# Patient Record
Sex: Male | Born: 1960 | Race: White | Hispanic: No | Smoking: Never smoker
Health system: Southern US, Community
[De-identification: ages and names within clinical notes are randomized; demographics above are authoritative.]

## PROBLEM LIST (undated history)

## (undated) DIAGNOSIS — E079 Disorder of thyroid, unspecified: Secondary | ICD-10-CM

## (undated) DIAGNOSIS — M47812 Spondylosis without myelopathy or radiculopathy, cervical region: Secondary | ICD-10-CM

## (undated) DIAGNOSIS — I6529 Occlusion and stenosis of unspecified carotid artery: Secondary | ICD-10-CM

## (undated) DIAGNOSIS — G8929 Other chronic pain: Secondary | ICD-10-CM

## (undated) DIAGNOSIS — E785 Hyperlipidemia, unspecified: Secondary | ICD-10-CM

## (undated) DIAGNOSIS — F329 Major depressive disorder, single episode, unspecified: Secondary | ICD-10-CM

## (undated) DIAGNOSIS — I10 Essential (primary) hypertension: Secondary | ICD-10-CM

## (undated) DIAGNOSIS — N200 Calculus of kidney: Secondary | ICD-10-CM

## (undated) DIAGNOSIS — E119 Type 2 diabetes mellitus without complications: Secondary | ICD-10-CM

## (undated) DIAGNOSIS — J45909 Unspecified asthma, uncomplicated: Secondary | ICD-10-CM

## (undated) DIAGNOSIS — I2510 Atherosclerotic heart disease of native coronary artery without angina pectoris: Secondary | ICD-10-CM

## (undated) DIAGNOSIS — F419 Anxiety disorder, unspecified: Secondary | ICD-10-CM

## (undated) DIAGNOSIS — K5090 Crohn's disease, unspecified, without complications: Secondary | ICD-10-CM

## (undated) DIAGNOSIS — F32A Depression, unspecified: Secondary | ICD-10-CM

## (undated) DIAGNOSIS — M791 Myalgia, unspecified site: Secondary | ICD-10-CM

## (undated) HISTORY — DX: Hypomagnesemia: E83.42

## (undated) HISTORY — PX: CORONARY ARTERY BYPASS GRAFT: SHX141

## (undated) HISTORY — DX: Depression, unspecified: F32.A

## (undated) HISTORY — DX: Occlusion and stenosis of unspecified carotid artery: I65.29

## (undated) HISTORY — DX: Myalgia, unspecified site: M79.10

## (undated) HISTORY — PX: KIDNEY STONE SURGERY: SHX686

## (undated) HISTORY — DX: Hyperlipidemia, unspecified: E78.5

## (undated) HISTORY — DX: Spondylosis without myelopathy or radiculopathy, cervical region: M47.812

## (undated) HISTORY — DX: Major depressive disorder, single episode, unspecified: F32.9

---

## 2008-01-05 ENCOUNTER — Emergency Department (HOSPITAL_BASED_OUTPATIENT_CLINIC_OR_DEPARTMENT_OTHER): Admission: EM | Admit: 2008-01-05 | Discharge: 2008-01-05 | Payer: Self-pay | Admitting: Emergency Medicine

## 2009-05-23 ENCOUNTER — Ambulatory Visit: Payer: Self-pay | Admitting: Diagnostic Radiology

## 2009-05-23 ENCOUNTER — Emergency Department (HOSPITAL_BASED_OUTPATIENT_CLINIC_OR_DEPARTMENT_OTHER): Admission: EM | Admit: 2009-05-23 | Discharge: 2009-05-23 | Payer: Self-pay | Admitting: Emergency Medicine

## 2010-06-10 LAB — URINALYSIS, ROUTINE W REFLEX MICROSCOPIC
Glucose, UA: >1000 mg/dL — AB
Hgb urine dipstick: NEGATIVE
Ketones, ur: 15 mg/dL — AB
Leukocytes, UA: NEGATIVE
Nitrite: NEGATIVE
Protein, ur: NEGATIVE mg/dL
Specific Gravity, Urine: 1.025 (ref 1.005–1.030)

## 2010-06-10 LAB — BASIC METABOLIC PANEL
CO2: 28 mEq/L (ref 19–32)
Calcium: 9.1 mg/dL (ref 8.4–10.5)
Creatinine, Ser: 0.8 mg/dL (ref 0.4–1.5)
GFR calc Af Amer: >60 mL/min (ref >60)
GFR calc non Af Amer: >60 mL/min (ref >60)

## 2010-06-10 LAB — DIFFERENTIAL
Basophils Absolute: 0 10*3/uL (ref 0.0–0.1)
Eosinophils Absolute: 0 10*3/uL (ref 0.0–0.7)
Eosinophils Relative: 0 % (ref 0–5)
Monocytes Absolute: 0.8 10*3/uL (ref 0.1–1.0)
Neutrophils Relative %: 75 % (ref 43–77)

## 2010-06-10 LAB — CBC
HCT: 48.4 % (ref 39.0–52.0)
Hemoglobin: 16.8 g/dL (ref 13.0–17.0)
RBC: 5.45 MIL/uL (ref 4.22–5.81)
WBC: 6 10*3/uL (ref 4.0–10.5)

## 2010-12-17 LAB — GLUCOSE, CAPILLARY: Glucose-Capillary: 236 — ABNORMAL HIGH

## 2012-06-09 ENCOUNTER — Encounter (HOSPITAL_BASED_OUTPATIENT_CLINIC_OR_DEPARTMENT_OTHER): Payer: Self-pay | Admitting: *Deleted

## 2012-06-09 ENCOUNTER — Emergency Department (HOSPITAL_BASED_OUTPATIENT_CLINIC_OR_DEPARTMENT_OTHER)
Admission: EM | Admit: 2012-06-09 | Discharge: 2012-06-09 | Disposition: A | Discharge status: Home / Self Care | Payer: Medicaid Other | Attending: Emergency Medicine | Admitting: Emergency Medicine

## 2012-06-09 ENCOUNTER — Emergency Department (HOSPITAL_BASED_OUTPATIENT_CLINIC_OR_DEPARTMENT_OTHER): Payer: Medicaid Other

## 2012-06-09 DIAGNOSIS — Z8719 Personal history of other diseases of the digestive system: Secondary | ICD-10-CM | POA: Insufficient documentation

## 2012-06-09 DIAGNOSIS — R0602 Shortness of breath: Secondary | ICD-10-CM | POA: Diagnosis present

## 2012-06-09 DIAGNOSIS — Z79899 Other long term (current) drug therapy: Secondary | ICD-10-CM | POA: Insufficient documentation

## 2012-06-09 DIAGNOSIS — E119 Type 2 diabetes mellitus without complications: Secondary | ICD-10-CM | POA: Diagnosis not present

## 2012-06-09 DIAGNOSIS — I10 Essential (primary) hypertension: Secondary | ICD-10-CM | POA: Diagnosis not present

## 2012-06-09 DIAGNOSIS — J45901 Unspecified asthma with (acute) exacerbation: Secondary | ICD-10-CM | POA: Diagnosis not present

## 2012-06-09 DIAGNOSIS — F411 Generalized anxiety disorder: Secondary | ICD-10-CM | POA: Diagnosis not present

## 2012-06-09 DIAGNOSIS — R0789 Other chest pain: Secondary | ICD-10-CM | POA: Insufficient documentation

## 2012-06-09 DIAGNOSIS — R002 Palpitations: Secondary | ICD-10-CM | POA: Diagnosis not present

## 2012-06-09 DIAGNOSIS — F419 Anxiety disorder, unspecified: Secondary | ICD-10-CM

## 2012-06-09 DIAGNOSIS — Z87442 Personal history of urinary calculi: Secondary | ICD-10-CM | POA: Insufficient documentation

## 2012-06-09 HISTORY — DX: Type 2 diabetes mellitus without complications: E11.9

## 2012-06-09 HISTORY — DX: Unspecified asthma, uncomplicated: J45.909

## 2012-06-09 HISTORY — DX: Calculus of kidney: N20.0

## 2012-06-09 HISTORY — DX: Anxiety disorder, unspecified: F41.9

## 2012-06-09 HISTORY — DX: Essential (primary) hypertension: I10

## 2012-06-09 HISTORY — DX: Crohn's disease, unspecified, without complications: K50.90

## 2012-06-09 LAB — COMPREHENSIVE METABOLIC PANEL
ALT: 31 U/L (ref 0–53)
AST: 24 U/L (ref 0–37)
CO2: 26 mEq/L (ref 19–32)
Chloride: 97 mEq/L (ref 96–112)
GFR calc Af Amer: >90 mL/min (ref >90)
Sodium: 136 mEq/L (ref 135–145)
Total Bilirubin: 0.4 mg/dL (ref 0.3–1.2)

## 2012-06-09 LAB — CBC WITH DIFFERENTIAL/PLATELET
Basophils Absolute: 0.1 10*3/uL (ref 0.0–0.1)
Basophils Relative: 1 % (ref 0–1)
Eosinophils Absolute: 0.2 10*3/uL (ref 0.0–0.7)
HCT: 48 % (ref 39.0–52.0)
Hemoglobin: 17.1 g/dL — ABNORMAL HIGH (ref 13.0–17.0)
Lymphocytes Relative: 22 % (ref 12–46)
Lymphs Abs: 2.3 10*3/uL (ref 0.7–4.0)
MCHC: 35.6 g/dL (ref 30.0–36.0)
Monocytes Absolute: 0.9 10*3/uL (ref 0.1–1.0)
Neutro Abs: 7.2 10*3/uL (ref 1.7–7.7)
Neutrophils Relative %: 68 % (ref 43–77)
Platelets: 317 10*3/uL (ref 150–400)

## 2012-06-09 LAB — TROPONIN I: Troponin I: <0.3 ng/mL (ref <0.30)

## 2012-06-09 MED ORDER — ONDANSETRON HCL 4 MG/2ML IJ SOLN: 4.0000 mg | Freq: Once | INTRAMUSCULAR | Status: DC

## 2012-06-09 MED ORDER — LORAZEPAM 2 MG/ML IJ SOLN
1.0000 mg | Freq: Once | INTRAMUSCULAR | Status: AC
  Administered 2012-06-09: 1 mg via INTRAVENOUS
  Filled 2012-06-09: qty 1

## 2012-06-09 MED ORDER — LORAZEPAM 1 MG PO TABS: 1.0000 mg | ORAL_TABLET | Freq: Three times a day (TID) | ORAL | Status: DC | PRN

## 2012-06-09 MED ORDER — ALBUTEROL SULFATE (5 MG/ML) 0.5% IN NEBU
5.0000 mg | INHALATION_SOLUTION | Freq: Once | RESPIRATORY_TRACT | Status: AC
  Administered 2012-06-09: 5 mg via RESPIRATORY_TRACT
  Filled 2012-06-09: qty 1

## 2012-06-09 MED ORDER — IPRATROPIUM BROMIDE 0.02 % IN SOLN
0.5000 mg | Freq: Once | RESPIRATORY_TRACT | Status: AC
  Administered 2012-06-09: 0.5 mg via RESPIRATORY_TRACT
  Filled 2012-06-09: qty 2.5

## 2012-06-09 NOTE — ED Provider Notes (Addendum)
History     CSN: [id number]  Arrival date & time 06/09/12  1658   First MD Initiated Contact with Patient 06/09/12 1701      Chief Complaint  Patient presents with  . Shortness of Breath    (Consider location/radiation/quality/duration/timing/severity/associated sxs/prior treatment) Patient is a 52 y.o. male presenting with chest pain. The history is provided by the patient.  Chest Pain Pain location:  Substernal area Pain quality: aching, pressure and tightness   Pain radiates to:  Does not radiate Pain radiates to the back: no   Pain severity:  Moderate Onset quality:  Sudden Duration:  1 hour Timing:  Constant Progression:  Unchanged Chronicity:  Recurrent Context: breathing, at rest and stress   Relieved by:  Nothing Worsened by:  Nothing tried Ineffective treatments:  None tried Associated symptoms: palpitations and shortness of breath   Associated symptoms: no abdominal pain, no cough, no diaphoresis, no dizziness, no fever, no nausea, no syncope, not vomiting and no weakness   Risk factors: diabetes mellitus and hypertension   Risk factors: no coronary artery disease, no high cholesterol, no immobilization, no prior DVT/PE and no smoking     Past Medical History  Diagnosis Date  . Asthma   . Hypertension   . Diabetes mellitus without complication   . Anxiety   . Kidney stones   . Crohn disease     Past Surgical History  Procedure Laterality Date  . Kidney stone surgery      No family history on file.  History  Substance Use Topics  . Smoking status: Never Smoker   . Smokeless tobacco: Never Used  . Alcohol Use: 1.2 oz/week    2 Cans of beer per week      Review of Systems  Constitutional: Negative for fever and diaphoresis.  Respiratory: Positive for shortness of breath. Negative for cough.   Cardiovascular: Positive for chest pain and palpitations. Negative for syncope.  Gastrointestinal: Negative for nausea, vomiting and abdominal pain.   Neurological: Negative for dizziness and weakness.  All other systems reviewed and are negative.    Allergies  Celebrex  Home Medications   Current Outpatient Rx  Name  Route  Sig  Dispense  Refill  . Cinnamon 500 MG capsule   Oral   Take 500 mg by mouth daily.         Marland Kitchen GLIPIZIDE PO   Oral   Take by mouth.         . hydrochlorothiazide (HYDRODIURIL) 25 MG tablet   Oral   Take 25 mg by mouth daily.         . metFORMIN (GLUCOPHAGE) 500 MG tablet   Oral   Take 500 mg by mouth 2 (two) times daily with a meal.         . Multiple Vitamin (MULTIVITAMIN) capsule   Oral   Take 1 capsule by mouth daily.           BP 201/88  Pulse 109  Temp(Src) 97.7 F (36.5 C) (Oral)  Resp 28  Ht 5\' 10"  (1.778 m)  Wt 226 lb (102.513 kg)  BMI 32.43 kg/m2  SpO2 96%  Physical Exam  Nursing note and vitals reviewed. Constitutional: He is oriented to person, place, and time. He appears well-developed and well-nourished. He appears distressed.  Anxious appearing  HENT:  Head: Normocephalic and atraumatic.  Mouth/Throat: Oropharynx is clear and moist.  Eyes: Conjunctivae and EOM are normal. Pupils are equal, round, and reactive to light.  Neck: Normal range of motion. Neck supple.  Cardiovascular: Regular rhythm and intact distal pulses.  Tachycardia present.   No murmur heard. Pulmonary/Chest: Effort normal and breath sounds normal. No respiratory distress. He has no wheezes. He has no rales. He exhibits no tenderness.  Abdominal: Soft. He exhibits no distension. There is no tenderness. There is no rebound and no guarding.  Musculoskeletal: Normal range of motion. He exhibits no edema and no tenderness.  Neurological: He is alert and oriented to person, place, and time.  Skin: Skin is warm and dry. No rash noted. No erythema.  Psychiatric: His behavior is normal. His mood appears anxious.    ED Course  Procedures (including critical care time)  Labs Reviewed  CBC WITH  DIFFERENTIAL - Abnormal; Notable for the following:    WBC 10.6 (*)    Hemoglobin 17.1 (*)    All other components within normal limits  COMPREHENSIVE METABOLIC PANEL - Abnormal; Notable for the following:    Glucose, Bld 249 (*)    All other components within normal limits  TROPONIN I  D-DIMER, QUANTITATIVE  TROPONIN I   Dg Chest 2 View  06/09/2012  *RADIOLOGY REPORT*  Clinical Data: Shortness of breath.  CHEST - 2 VIEW  Comparison: None.  Findings: Lungs are clear.  Heart size is normal.  No pneumothorax or pleural fluid.  IMPRESSION: Negative chest.   Original Report Authenticated By: Holley Dexter, M.D.      Date: 06/09/2012  Rate: 112  Rhythm: sinus tachycardia  QRS Axis: normal  Intervals: normal  ST/T Wave abnormalities: normal  Conduction Disutrbances:none  Narrative Interpretation:   Old EKG Reviewed: none available    1. Atypical chest pain   2. Anxiety       MDM   Patient presenting with abrupt onset of chest tightness, shortness of breath that started 1 hour ago. He is describing tightness in his chest, shortness of breath and impending feeling of doom. Patient appears anxious and is to, hypertensive and mildly tachycardic. He denies any prior heart history and states that 2 years ago he had an extensive workup done in [facility] including stress testing for similar events and was diagnosed with anxiety. He also complains that there's been a lot of increased stress in his life recently and he has been more anxious. He denies any upper respiratory issues and has not been wheezing.  No wrist fractures for PE except for tachycardia. EKG shows a sinus tachycardia but no other symptoms concerning for cardiac etiology. No current wheezing on exam and other than appearing anxious with mild tachycardia patient's exam is negative. Will give 1 mg of Ativan. Chest x-ray, CBC, BMP, troponin, d-dimer pending  7:13 PM All labs within normal limits. On repeat exam  patient states that Ativan improved some but he still feels tightness and shortness of breath. Will try albuterol and Atrovent and received a 3 hour troponin.    8:57 PM After second dose of ativan and albuterol/atrovent pt feels much better.  HR improved to the 90's.  Second troponin neg.  Will d/c home and have pt f/u with PCP if symptoms persist.  Gwyneth Sprout, MD 06/09/12 1610  Gwyneth Sprout, MD 06/09/12 2059

## 2012-06-09 NOTE — ED Notes (Signed)
Pt reports sob x 1 hour- hx of asthma

## 2012-06-09 NOTE — ED Notes (Signed)
Pt. states the HHN helped "some", but still feels tight in his chest.

## 2012-06-09 NOTE — ED Notes (Signed)
Chest tightness with SHOB, no radiation x 1 1/2 hours.  No n/v.  Reports diaphoresis earlier.  Describes pain as "something sitting on my chest".   Hasn't taken any meds for pain.

## 2013-06-29 ENCOUNTER — Encounter (HOSPITAL_BASED_OUTPATIENT_CLINIC_OR_DEPARTMENT_OTHER): Payer: Self-pay | Admitting: Emergency Medicine

## 2013-06-29 ENCOUNTER — Emergency Department (HOSPITAL_BASED_OUTPATIENT_CLINIC_OR_DEPARTMENT_OTHER)
Admission: EM | Admit: 2013-06-29 | Discharge: 2013-06-29 | Disposition: A | Discharge status: Home / Self Care | Payer: Medicaid Other | Attending: Emergency Medicine | Admitting: Emergency Medicine

## 2013-06-29 DIAGNOSIS — Z87442 Personal history of urinary calculi: Secondary | ICD-10-CM | POA: Diagnosis not present

## 2013-06-29 DIAGNOSIS — F411 Generalized anxiety disorder: Secondary | ICD-10-CM | POA: Diagnosis not present

## 2013-06-29 DIAGNOSIS — Z8719 Personal history of other diseases of the digestive system: Secondary | ICD-10-CM | POA: Diagnosis not present

## 2013-06-29 DIAGNOSIS — J45909 Unspecified asthma, uncomplicated: Secondary | ICD-10-CM | POA: Insufficient documentation

## 2013-06-29 DIAGNOSIS — IMO0001 Reserved for inherently not codable concepts without codable children: Secondary | ICD-10-CM | POA: Insufficient documentation

## 2013-06-29 DIAGNOSIS — Z79899 Other long term (current) drug therapy: Secondary | ICD-10-CM | POA: Diagnosis not present

## 2013-06-29 DIAGNOSIS — I10 Essential (primary) hypertension: Secondary | ICD-10-CM | POA: Insufficient documentation

## 2013-06-29 DIAGNOSIS — R52 Pain, unspecified: Secondary | ICD-10-CM | POA: Diagnosis present

## 2013-06-29 DIAGNOSIS — M791 Myalgia, unspecified site: Secondary | ICD-10-CM

## 2013-06-29 DIAGNOSIS — E119 Type 2 diabetes mellitus without complications: Secondary | ICD-10-CM | POA: Insufficient documentation

## 2013-06-29 HISTORY — DX: Disorder of thyroid, unspecified: E07.9

## 2013-06-29 LAB — CBC WITH DIFFERENTIAL/PLATELET
BASOS ABS: 0.1 10*3/uL (ref 0.0–0.1)
Basophils Relative: 1 % (ref 0–1)
EOS ABS: 0.1 10*3/uL (ref 0.0–0.7)
EOS PCT: 1 % (ref 0–5)
HCT: 47.7 % (ref 39.0–52.0)
Hemoglobin: 17 g/dL (ref 13.0–17.0)
Lymphocytes Relative: 19 % (ref 12–46)
Lymphs Abs: 1.8 10*3/uL (ref 0.7–4.0)
MCH: 30.8 pg (ref 26.0–34.0)
MCHC: 35.6 g/dL (ref 30.0–36.0)
MCV: 86.4 fL (ref 78.0–100.0)
Monocytes Absolute: 0.8 10*3/uL (ref 0.1–1.0)
Monocytes Relative: 8 % (ref 3–12)
NEUTROS PCT: 71 % (ref 43–77)
Neutro Abs: 6.8 10*3/uL (ref 1.7–7.7)
Platelets: 279 10*3/uL (ref 150–400)
RBC: 5.52 MIL/uL (ref 4.22–5.81)
RDW: 12.7 % (ref 11.5–15.5)
WBC: 9.5 10*3/uL (ref 4.0–10.5)

## 2013-06-29 LAB — COMPREHENSIVE METABOLIC PANEL
ALK PHOS: 57 U/L (ref 39–117)
ALT: 23 U/L (ref 0–53)
AST: 19 U/L (ref 0–37)
Albumin: 4 g/dL (ref 3.5–5.2)
BUN: 13 mg/dL (ref 6–23)
CALCIUM: 10.6 mg/dL — AB (ref 8.4–10.5)
CO2: 28 mEq/L (ref 19–32)
Chloride: 96 mEq/L (ref 96–112)
Creatinine, Ser: 0.8 mg/dL (ref 0.50–1.35)
GFR calc Af Amer: >90 mL/min (ref >90)
GFR calc non Af Amer: >90 mL/min (ref >90)
Glucose, Bld: 205 mg/dL — ABNORMAL HIGH (ref 70–99)
POTASSIUM: 4.3 meq/L (ref 3.7–5.3)
Sodium: 138 mEq/L (ref 137–147)
TOTAL PROTEIN: 7.4 g/dL (ref 6.0–8.3)
Total Bilirubin: 0.4 mg/dL (ref 0.3–1.2)

## 2013-06-29 LAB — SEDIMENTATION RATE: Sed Rate: 2 mm/hr (ref 0–16)

## 2013-06-29 LAB — URINALYSIS, ROUTINE W REFLEX MICROSCOPIC
Bilirubin Urine: NEGATIVE
GLUCOSE, UA: NEGATIVE mg/dL
HGB URINE DIPSTICK: NEGATIVE
Ketones, ur: NEGATIVE mg/dL
Leukocytes, UA: NEGATIVE
NITRITE: NEGATIVE
PH: 5 (ref 5.0–8.0)
PROTEIN: NEGATIVE mg/dL
Specific Gravity, Urine: 1.007 (ref 1.005–1.030)
Urobilinogen, UA: 0.2 mg/dL (ref 0.0–1.0)

## 2013-06-29 MED ORDER — HYDROCODONE-ACETAMINOPHEN 5-325 MG PO TABS: 2.0000 | ORAL_TABLET | ORAL | Status: DC | PRN

## 2013-06-29 MED ORDER — IBUPROFEN 800 MG PO TABS: 800.0000 mg | ORAL_TABLET | Freq: Three times a day (TID) | ORAL | Status: DC

## 2013-06-29 NOTE — ED Provider Notes (Signed)
Medical screening examination/treatment/procedure(s) were performed by non-physician practitioner and as supervising physician I was immediately available for consultation/collaboration.   EKG Interpretation None        Rolan BuccoMelanie Verlena Marlette, MD 06/29/13 1947

## 2013-06-29 NOTE — Discharge Instructions (Signed)
Myalgia, Adult °Myalgia is the medical term for muscle pain. It is a symptom of many things. Nearly everyone at some time in their life has this. The most common cause for muscle pain is overuse or straining and more so when you are not in shape. Injuries and muscle bruises cause myalgias. Muscle pain without a history of injury can also be caused by a virus. It frequently comes along with the flu. Myalgia not caused by muscle strain can be present in a large number of infectious diseases. Some autoimmune diseases like lupus and fibromyalgia can cause muscle pain. Myalgia may be mild, or severe. °SYMPTOMS  °The symptoms of myalgia are simply muscle pain. Most of the time this is short lived and the pain goes away without treatment. °DIAGNOSIS  °Myalgia is diagnosed by your caregiver by taking your history. This means you tell him when the problems began, what they are, and what has been happening. If this has not been a long term problem, your caregiver may want to watch for a while to see what will happen. If it has been long term, they may want to do additional testing. °TREATMENT  °The treatment depends on what the underlying cause of the muscle pain is. Often anti-inflammatory medications will help. °HOME CARE INSTRUCTIONS °· If the pain in your muscles came from overuse, slow down your activities until the problems go away. °· Myalgia from overuse of a muscle can be treated with alternating hot and cold packs on the muscle affected or with cold for the first couple days. If either heat or cold seems to make things worse, stop their use. °· Apply ice to the sore area for 15-20 minutes, 03-04 times per day, while awake for the first 2 days of muscle soreness, or as directed. Put the ice in a plastic bag and place a towel between the bag of ice and your skin. °· Only take over-the-counter or prescription medicines for pain, discomfort, or fever as directed by your caregiver. °· Regular gentle exercise may help if  you are not active. °· Stretching before strenuous exercise can help lower the risk of myalgia. It is normal when beginning an exercise regimen to feel some muscle pain after exercising. Muscles that have not been used frequently will be sore at first. If the pain is extreme, this may mean injury to a muscle. °SEEK MEDICAL CARE IF: °· You have an increase in muscle pain that is not relieved with medication. °· You begin to run a temperature. °· You develop nausea and vomiting. °· You develop a stiff and painful neck. °· You develop a rash. °· You develop muscle pain after a tick bite. °· You have continued muscle pain while working out even after you are in good condition. °SEEK IMMEDIATE MEDICAL CARE IF: °Any of your problems are getting worse and medications are not helping. °MAKE SURE YOU:  °· Understand these instructions. °· Will watch your condition. °· Will get help right away if you are not doing well or get worse. °Document Released: 01/23/2006 Document Revised: 05/26/2011 Document Reviewed: 04/14/2006 °[employer]® Patient Information ©2014 [employer]. ° °

## 2013-06-29 NOTE — ED Provider Notes (Signed)
CSN: [id number]     Arrival date & time 06/29/13  1626 History   First MD Initiated Contact with Patient 06/29/13 1652     Chief Complaint  Patient presents with  . Generalized Body Aches     (Consider location/radiation/quality/duration/timing/severity/associated sxs/prior Treatment) Patient is a 53 y.o. male presenting with musculoskeletal pain. The history is provided by the patient. No language interpreter was used.  Muscle Pain This is a new problem. The current episode started 1 to 4 weeks ago. The problem occurs constantly. The problem has been gradually worsening. Pertinent negatives include no fever, headaches or rash. Nothing aggravates the symptoms. He has tried nothing for the symptoms. The treatment provided no relief.  Pt complains of a tick bite a week ago.  Pt saw MD and had a negative rmsf and lyme.  Pt reports continued bodt aces  Past Medical History  Diagnosis Date  . Asthma   . Hypertension   . Diabetes mellitus without complication   . Anxiety   . Kidney stones   . Crohn disease   . Thyroid disease    Past Surgical History  Procedure Laterality Date  . Kidney stone surgery     No family history on file. History  Substance Use Topics  . Smoking status: Never Smoker   . Smokeless tobacco: Never Used  . Alcohol Use: Yes    Review of Systems  Constitutional: Negative for fever.  Skin: Negative for rash.  Neurological: Negative for headaches.  All other systems reviewed and are negative.     Allergies  Celebrex  Home Medications   Prior to Admission medications   Medication Sig Start Date End Date Taking? Authorizing Provider  Cinnamon 500 MG capsule Take 500 mg by mouth daily.    Historical Provider, MD  GLIPIZIDE PO Take by mouth.    Historical Provider, MD  hydrochlorothiazide (HYDRODIURIL) 25 MG tablet Take 25 mg by mouth daily.    Historical Provider, MD  LORazepam (ATIVAN) 1 MG tablet Take 1 tablet (1 mg total) by mouth 3 (three) times  daily as needed for anxiety. 06/09/12   Gwyneth SproutWhitney Plunkett, MD  metFORMIN (GLUCOPHAGE) 500 MG tablet Take 500 mg by mouth 2 (two) times daily with a meal.    Historical Provider, MD  Multiple Vitamin (MULTIVITAMIN) capsule Take 1 capsule by mouth daily.    Historical Provider, MD   BP 176/90  Pulse 72  Temp(Src) 98.2 F (36.8 C) (Oral)  Resp 20  Ht 5\' 10"  (1.778 m)  Wt 220 lb (99.791 kg)  BMI 31.57 kg/m2  SpO2 100% Physical Exam  Nursing note and vitals reviewed. Constitutional: He is oriented to person, place, and time. He appears well-developed and well-nourished.  HENT:  Head: Normocephalic and atraumatic.  Right Ear: External ear normal.  Left Ear: External ear normal.  Nose: Nose normal.  Mouth/Throat: Oropharynx is clear and moist.  Eyes: Conjunctivae and EOM are normal. Pupils are equal, round, and reactive to light.  Neck: Normal range of motion.  Cardiovascular: Normal rate and normal heart sounds.   Pulmonary/Chest: Effort normal.  Abdominal: Soft. He exhibits no distension.  Musculoskeletal: Normal range of motion.  Neurological: He is alert and oriented to person, place, and time.  Skin: Skin is warm.  Psychiatric: He has a normal mood and affect.    ED Course  Procedures (including critical care time) Labs Review Labs Reviewed - No data to display  Imaging Review No results found.   EKG Interpretation None  MDM  Labs normal, sed rate normal.   Pt advised to follow up with his MD for recheck.   Final diagnoses:  None   Results for orders placed during the hospital encounter of 06/29/13  URINALYSIS, ROUTINE W REFLEX MICROSCOPIC      Result Value Ref Range   Color, Urine YELLOW  YELLOW   APPearance CLEAR  CLEAR   Specific Gravity, Urine 1.007  1.005 - 1.030   pH 5.0  5.0 - 8.0   Glucose, UA NEGATIVE  NEGATIVE mg/dL   Hgb urine dipstick NEGATIVE  NEGATIVE   Bilirubin Urine NEGATIVE  NEGATIVE   Ketones, ur NEGATIVE  NEGATIVE mg/dL   Protein, ur  NEGATIVE  NEGATIVE mg/dL   Urobilinogen, UA 0.2  0.0 - 1.0 mg/dL   Nitrite NEGATIVE  NEGATIVE   Leukocytes, UA NEGATIVE  NEGATIVE  CBC WITH DIFFERENTIAL      Result Value Ref Range   WBC 9.5  4.0 - 10.5 K/uL   RBC 5.52  4.22 - 5.81 MIL/uL   Hemoglobin 17.0  13.0 - 17.0 g/dL   HCT 29.547.7  62.139.0 - 30.852.0 %   MCV 86.4  78.0 - 100.0 fL   MCH 30.8  26.0 - 34.0 pg   MCHC 35.6  30.0 - 36.0 g/dL   RDW 65.712.7  84.611.5 - 96.215.5 %   Platelets 279  150 - 400 K/uL   Neutrophils Relative % 71  43 - 77 %   Neutro Abs 6.8  1.7 - 7.7 K/uL   Lymphocytes Relative 19  12 - 46 %   Lymphs Abs 1.8  0.7 - 4.0 K/uL   Monocytes Relative 8  3 - 12 %   Monocytes Absolute 0.8  0.1 - 1.0 K/uL   Eosinophils Relative 1  0 - 5 %   Eosinophils Absolute 0.1  0.0 - 0.7 K/uL   Basophils Relative 1  0 - 1 %   Basophils Absolute 0.1  0.0 - 0.1 K/uL  COMPREHENSIVE METABOLIC PANEL      Result Value Ref Range   Sodium 138  137 - 147 mEq/L   Potassium 4.3  3.7 - 5.3 mEq/L   Chloride 96  96 - 112 mEq/L   CO2 28  19 - 32 mEq/L   Glucose, Bld 205 (*) 70 - 99 mg/dL   BUN 13  6 - 23 mg/dL   Creatinine, Ser 9.520.80  0.50 - 1.35 mg/dL   Calcium 84.110.6 (*) 8.4 - 10.5 mg/dL   Total Protein 7.4  6.0 - 8.3 g/dL   Albumin 4.0  3.5 - 5.2 g/dL   AST 19  0 - 37 U/L   ALT 23  0 - 53 U/L   Alkaline Phosphatase 57  39 - 117 U/L   Total Bilirubin 0.4  0.3 - 1.2 mg/dL   GFR calc non Af Amer >90  >90 mL/min   GFR calc Af Amer >90  >90 mL/min  SEDIMENTATION RATE      Result Value Ref Range   Sed Rate 2  0 - 16 mm/hr   No results found.     Lonia SkinnerLeslie K AngolaSofia, PA-C 06/29/13 1924

## 2013-06-29 NOTE — ED Notes (Signed)
Tick bite approx 1 weeks ago-was seen by PCP-neg tests for RMSF and Lymes-pt c/o cont'd body aches

## 2013-06-29 NOTE — ED Notes (Signed)
PA at bedside.

## 2013-07-12 ENCOUNTER — Emergency Department (HOSPITAL_BASED_OUTPATIENT_CLINIC_OR_DEPARTMENT_OTHER)
Admission: EM | Admit: 2013-07-12 | Discharge: 2013-07-12 | Disposition: A | Discharge status: Home / Self Care | Payer: Medicaid Other | Attending: Emergency Medicine | Admitting: Emergency Medicine

## 2013-07-12 ENCOUNTER — Encounter (HOSPITAL_BASED_OUTPATIENT_CLINIC_OR_DEPARTMENT_OTHER): Payer: Self-pay | Admitting: Emergency Medicine

## 2013-07-12 DIAGNOSIS — IMO0001 Reserved for inherently not codable concepts without codable children: Secondary | ICD-10-CM | POA: Diagnosis present

## 2013-07-12 DIAGNOSIS — R110 Nausea: Secondary | ICD-10-CM | POA: Diagnosis not present

## 2013-07-12 DIAGNOSIS — Z8719 Personal history of other diseases of the digestive system: Secondary | ICD-10-CM | POA: Insufficient documentation

## 2013-07-12 DIAGNOSIS — Z791 Long term (current) use of non-steroidal anti-inflammatories (NSAID): Secondary | ICD-10-CM | POA: Diagnosis not present

## 2013-07-12 DIAGNOSIS — Z87442 Personal history of urinary calculi: Secondary | ICD-10-CM | POA: Insufficient documentation

## 2013-07-12 DIAGNOSIS — M791 Myalgia, unspecified site: Secondary | ICD-10-CM

## 2013-07-12 DIAGNOSIS — E119 Type 2 diabetes mellitus without complications: Secondary | ICD-10-CM | POA: Diagnosis not present

## 2013-07-12 DIAGNOSIS — M549 Dorsalgia, unspecified: Secondary | ICD-10-CM | POA: Insufficient documentation

## 2013-07-12 DIAGNOSIS — M79609 Pain in unspecified limb: Secondary | ICD-10-CM | POA: Diagnosis not present

## 2013-07-12 DIAGNOSIS — Z79899 Other long term (current) drug therapy: Secondary | ICD-10-CM | POA: Diagnosis not present

## 2013-07-12 DIAGNOSIS — I10 Essential (primary) hypertension: Secondary | ICD-10-CM | POA: Insufficient documentation

## 2013-07-12 DIAGNOSIS — F411 Generalized anxiety disorder: Secondary | ICD-10-CM | POA: Insufficient documentation

## 2013-07-12 DIAGNOSIS — J45909 Unspecified asthma, uncomplicated: Secondary | ICD-10-CM | POA: Insufficient documentation

## 2013-07-12 LAB — URINALYSIS, ROUTINE W REFLEX MICROSCOPIC
Glucose, UA: NEGATIVE mg/dL
HGB URINE DIPSTICK: NEGATIVE
Ketones, ur: NEGATIVE mg/dL
LEUKOCYTES UA: NEGATIVE
Nitrite: NEGATIVE
Protein, ur: NEGATIVE mg/dL
Specific Gravity, Urine: 1.025 (ref 1.005–1.030)
UROBILINOGEN UA: 1 mg/dL (ref 0.0–1.0)
pH: 6 (ref 5.0–8.0)

## 2013-07-12 LAB — CBC
HCT: 44.8 % (ref 39.0–52.0)
HEMOGLOBIN: 15.9 g/dL (ref 13.0–17.0)
MCH: 30.5 pg (ref 26.0–34.0)
MCHC: 35.5 g/dL (ref 30.0–36.0)
MCV: 86 fL (ref 78.0–100.0)
Platelets: 250 10*3/uL (ref 150–400)
RBC: 5.21 MIL/uL (ref 4.22–5.81)
RDW: 12.8 % (ref 11.5–15.5)
WBC: 9.5 10*3/uL (ref 4.0–10.5)

## 2013-07-12 LAB — COMPREHENSIVE METABOLIC PANEL
ALT: 24 U/L (ref 0–53)
AST: 21 U/L (ref 0–37)
Albumin: 4 g/dL (ref 3.5–5.2)
Alkaline Phosphatase: 52 U/L (ref 39–117)
BUN: 21 mg/dL (ref 6–23)
CALCIUM: 10.1 mg/dL (ref 8.4–10.5)
CO2: 26 meq/L (ref 19–32)
CREATININE: 0.9 mg/dL (ref 0.50–1.35)
Chloride: 99 mEq/L (ref 96–112)
GFR calc Af Amer: >90 mL/min (ref >90)
GLUCOSE: 87 mg/dL (ref 70–99)
Potassium: 3.5 mEq/L — ABNORMAL LOW (ref 3.7–5.3)
Sodium: 143 mEq/L (ref 137–147)
Total Bilirubin: 0.4 mg/dL (ref 0.3–1.2)
Total Protein: 7.1 g/dL (ref 6.0–8.3)

## 2013-07-12 LAB — CK: CK TOTAL: 99 U/L (ref 7–232)

## 2013-07-12 MED ORDER — OXYCODONE-ACETAMINOPHEN 5-325 MG PO TABS
2.0000 | ORAL_TABLET | Freq: Once | ORAL | Status: AC
  Administered 2013-07-12: 2 via ORAL
  Filled 2013-07-12: qty 2

## 2013-07-12 MED ORDER — OXYCODONE-ACETAMINOPHEN 5-325 MG PO TABS: 1.0000 | ORAL_TABLET | ORAL | Status: DC | PRN

## 2013-07-12 NOTE — ED Provider Notes (Signed)
TIME SEEN: 9:42 PM  CHIEF COMPLAINT: myalgia   HPI: HPI Comments: Nathan Durham is a 53 y.o. male with history of hypertension, diabetes, hypothyroidism who presents to the Emergency Department complaining of recurrent, worsening myalgia including leg, arm and back pain, ongoing for greater than two weeks. Patient has been treating his pain with ibuprofen without relief. Patient reports some nausea with his pain but no vomiting, diarrhea, fever. He was seen in the emergency department 2 weeks ago and had negative workup including normal CBC, BMP, ESR. Patient had a recent tick bite but was evaluated by his PCP and had Lyme and RMSF titers drawn which were negative. He denies taking any new medications. He is not on a statin. He states he is concerned because his aunt was diagnosed with what he believes was polymyalgia rheumatica. He states he is back to the emergency department tonight because he does not have insurance to followup with a primary care physician.   ROS: See HPI Constitutional: no fever  Eyes: no drainage  ENT: no runny nose   Cardiovascular:  no chest pain  Resp: no SOB  GI: no vomiting GU: no dysuria Integumentary: rash  Allergy: no hives  Musculoskeletal: no leg swelling  Neurological: no slurred speech ROS otherwise negative  PAST MEDICAL HISTORY/PAST SURGICAL HISTORY:  Past Medical History  Diagnosis Date  . Asthma   . Hypertension   . Diabetes mellitus without complication   . Anxiety   . Kidney stones   . Crohn disease   . Thyroid disease     MEDICATIONS:  Prior to Admission medications   Medication Sig Start Date End Date Taking? Authorizing Provider  Cinnamon 500 MG capsule Take 500 mg by mouth daily.    Historical Provider, MD  GLIPIZIDE PO Take by mouth.    Historical Provider, MD  hydrochlorothiazide (HYDRODIURIL) 25 MG tablet Take 25 mg by mouth daily.    Historical Provider, MD  HYDROcodone-acetaminophen (NORCO/VICODIN) 5-325 MG per tablet  Take 2 tablets by mouth every 4 (four) hours as needed. 06/29/13   Fransico Meadow, PA-C  ibuprofen (ADVIL,MOTRIN) 800 MG tablet Take 1 tablet (800 mg total) by mouth 3 (three) times daily. 06/29/13   Fransico Meadow, PA-C  LORazepam (ATIVAN) 1 MG tablet Take 1 tablet (1 mg total) by mouth 3 (three) times daily as needed for anxiety. 06/09/12   Blanchie Dessert, MD  metFORMIN (GLUCOPHAGE) 500 MG tablet Take 500 mg by mouth 2 (two) times daily with a meal.    Historical Provider, MD  Multiple Vitamin (MULTIVITAMIN) capsule Take 1 capsule by mouth daily.    Historical Provider, MD    ALLERGIES:  Allergies  Allergen Reactions  . Celebrex [Celecoxib]     Chest tight-     SOCIAL HISTORY:  History  Substance Use Topics  . Smoking status: Never Smoker   . Smokeless tobacco: Never Used  . Alcohol Use: Yes    FAMILY HISTORY: No family history on file.  EXAM: Triage Vitals: BP 167/89  Pulse 90  Temp(Src) 97.7 F (36.5 C) (Oral)  Resp 20  Ht _0  (1.778 m)  Wt 220 lb (99.791 kg)  BMI 31.57 kg/m2  SpO2 100% CONSTITUTIONAL: Alert and oriented and responds appropriately to questions. Well-appearing; well-nourished HEAD: Normocephalic EYES: Conjunctivae clear, PERRL ENT: normal nose; no rhinorrhea; moist mucous membranes; pharynx without lesions noted NECK: Supple, no meningismus, no LAD  CARD: RRR; S1 and S2 appreciated; no murmurs, no clicks, no rubs, no gallops RESP:  Normal chest excursion without splinting or tachypnea; breath sounds clear and equal bilaterally; no wheezes, no rhonchi, no rales,  ABD/GI: Normal bowel sounds; non-distended; soft, non-tender, no rebound, no guarding BACK:  The back appears normal and is non-tender to palpation, there is no CVA tenderness EXT: Normal ROM in all joints; non-tender to palpation; no edema; normal capillary refill; no cyanosis, no tenderness to palpation of his muscles, compartments are soft, 2+ DP and radial pulses bilaterally, no joint  effusion, no induration or warmth or erythema or fluctuance    SKIN: Normal color for age and race; warm, no rash, no petechiae or purpura NEURO: Moves all extremities equally, sensation to light touch intact diffusely PSYCH: The patient's mood and manner are appropriate. Grooming and personal hygiene are appropriate.  MEDICAL DECISION MAKING: Patient here with 2 weeks of myalgias. Have discussed with patient that he may have an autoimmune disorder and needs to followup with her PCP and possible rheumatologist. We'll repeat basic labs, urine, CT to evaluate for possible acute muscle damage. We'll give Percocet for pain.  ED PROGRESS: Patient's repeat labs are unremarkable. There is no sign of myoglobin in history. His CT is also normal. He is feeling much better after Percocet. Have discussed with patient that I feel there is no life-threatening illness at this time and I recommend he followup as an outpatient for further testing including possible muscle biopsy. We'll discharge with small prescription for pain medication as he reports anti-inflammatories are not helping. Patient wife at bedside verbalize understanding and are comfortable with this plan.     [place], DO 07/13/13 0001

## 2013-07-12 NOTE — ED Notes (Signed)
2 weeks ago pt states he awoke with generalized body aches, thought he had the flu, pain in arms legs and back has progressed. At times one is worse than the other, pt [facility]. Seen previously here in er given, ibuprofen, but did not continue to take medication at home.

## 2013-07-12 NOTE — Discharge Instructions (Signed)
Myalgia, Adult °Myalgia is the medical term for muscle pain. It is a symptom of many things. Nearly everyone at some time in their life has this. The most common cause for muscle pain is overuse or straining and more so when you are not in shape. Injuries and muscle bruises cause myalgias. Muscle pain without a history of injury can also be caused by a virus. It frequently comes along with the flu. Myalgia not caused by muscle strain can be present in a large number of infectious diseases. Some autoimmune diseases like lupus and fibromyalgia can cause muscle pain. Myalgia may be mild, or severe. °SYMPTOMS  °The symptoms of myalgia are simply muscle pain. Most of the time this is short lived and the pain goes away without treatment. °DIAGNOSIS  °Myalgia is diagnosed by your caregiver by taking your history. This means you tell him when the problems began, what they are, and what has been happening. If this has not been a long term problem, your caregiver may want to watch for a while to see what will happen. If it has been long term, they may want to do additional testing. °TREATMENT  °The treatment depends on what the underlying cause of the muscle pain is. Often anti-inflammatory medications will help. °HOME CARE INSTRUCTIONS °· If the pain in your muscles came from overuse, slow down your activities until the problems go away. °· Myalgia from overuse of a muscle can be treated with alternating hot and cold packs on the muscle affected or with cold for the first couple days. If either heat or cold seems to make things worse, stop their use. °· Apply ice to the sore area for 15-20 minutes, 03-04 times per day, while awake for the first 2 days of muscle soreness, or as directed. Put the ice in a plastic bag and place a towel between the bag of ice and your skin. °· Only take over-the-counter or prescription medicines for pain, discomfort, or fever as directed by your caregiver. °· Regular gentle exercise may help if  you are not active. °· Stretching before strenuous exercise can help lower the risk of myalgia. It is normal when beginning an exercise regimen to feel some muscle pain after exercising. Muscles that have not been used frequently will be sore at first. If the pain is extreme, this may mean injury to a muscle. °SEEK MEDICAL CARE IF: °· You have an increase in muscle pain that is not relieved with medication. °· You begin to run a temperature. °· You develop nausea and vomiting. °· You develop a stiff and painful neck. °· You develop a rash. °· You develop muscle pain after a tick bite. °· You have continued muscle pain while working out even after you are in good condition. °SEEK IMMEDIATE MEDICAL CARE IF: °Any of your problems are getting worse and medications are not helping. °MAKE SURE YOU:  °· Understand these instructions. °· Will watch your condition. °· Will get help right away if you are not doing well or get worse. °Document Released: 01/23/2006 Document Revised: 05/26/2011 Document Reviewed: 04/14/2006 °[employer]® Patient Information ©2014 [employer]. ° °

## 2013-07-12 NOTE — ED Notes (Signed)
Muscle pain for 2 weeks.

## 2014-01-17 ENCOUNTER — Encounter (HOSPITAL_BASED_OUTPATIENT_CLINIC_OR_DEPARTMENT_OTHER): Payer: Self-pay | Admitting: *Deleted

## 2014-01-17 ENCOUNTER — Emergency Department (HOSPITAL_BASED_OUTPATIENT_CLINIC_OR_DEPARTMENT_OTHER)
Admission: EM | Admit: 2014-01-17 | Discharge: 2014-01-18 | Disposition: A | Discharge status: Home / Self Care | Payer: Medicaid Other | Attending: Emergency Medicine | Admitting: Emergency Medicine

## 2014-01-17 DIAGNOSIS — J45909 Unspecified asthma, uncomplicated: Secondary | ICD-10-CM | POA: Insufficient documentation

## 2014-01-17 DIAGNOSIS — I10 Essential (primary) hypertension: Secondary | ICD-10-CM | POA: Insufficient documentation

## 2014-01-17 DIAGNOSIS — Z79899 Other long term (current) drug therapy: Secondary | ICD-10-CM | POA: Diagnosis not present

## 2014-01-17 DIAGNOSIS — F419 Anxiety disorder, unspecified: Secondary | ICD-10-CM | POA: Insufficient documentation

## 2014-01-17 DIAGNOSIS — M791 Myalgia, unspecified site: Secondary | ICD-10-CM

## 2014-01-17 DIAGNOSIS — Z87442 Personal history of urinary calculi: Secondary | ICD-10-CM | POA: Diagnosis not present

## 2014-01-17 DIAGNOSIS — E119 Type 2 diabetes mellitus without complications: Secondary | ICD-10-CM | POA: Diagnosis not present

## 2014-01-17 DIAGNOSIS — R630 Anorexia: Secondary | ICD-10-CM | POA: Diagnosis not present

## 2014-01-17 DIAGNOSIS — R112 Nausea with vomiting, unspecified: Secondary | ICD-10-CM | POA: Diagnosis not present

## 2014-01-17 DIAGNOSIS — R52 Pain, unspecified: Secondary | ICD-10-CM | POA: Insufficient documentation

## 2014-01-17 DIAGNOSIS — Z8719 Personal history of other diseases of the digestive system: Secondary | ICD-10-CM | POA: Insufficient documentation

## 2014-01-17 DIAGNOSIS — E079 Disorder of thyroid, unspecified: Secondary | ICD-10-CM | POA: Insufficient documentation

## 2014-01-17 MED ORDER — ONDANSETRON HCL 4 MG/2ML IJ SOLN
4.0000 mg | Freq: Once | INTRAMUSCULAR | Status: AC
  Administered 2014-01-18: 4 mg via INTRAVENOUS
  Filled 2014-01-17: qty 2

## 2014-01-17 MED ORDER — MORPHINE SULFATE 4 MG/ML IJ SOLN
4.0000 mg | Freq: Once | INTRAMUSCULAR | Status: AC
  Administered 2014-01-18: 4 mg via INTRAVENOUS
  Filled 2014-01-17: qty 1

## 2014-01-17 MED ORDER — SODIUM CHLORIDE 0.9 % IV BOLUS (SEPSIS)
1000.0000 mL | Freq: Once | INTRAVENOUS | Status: AC
  Administered 2014-01-18: 1000 mL via INTRAVENOUS

## 2014-01-17 NOTE — ED Provider Notes (Addendum)
CSN: [id number]     Arrival date & time 01/17/14  2316 History  This chart was scribed for Gwyneth SproutWhitney Erendira Crabtree, MD by Evon Slackerrance Branch, ED Scribe. This patient was seen in room MH09/MH09 and the patient's care was started at 11:40 PM.     Chief Complaint  Patient presents with  . Muscle Pain    Patient is a 53 y.o. male presenting with musculoskeletal pain. The history is provided by the patient. No language interpreter was used.  Muscle Pain   HPI Comments: Nathan Durham is a 53 y.o. male who presents to the Emergency Department complaining of generalized myalgias onset 6 months prior he had seen a rheumatologist and was initially diagnosed with polymyalgias and started on prednisone which help with his pain. He was later told he did not have polymyalgias and started tapering off of the prednisone about 2-3 weeks ago which is making his pain worse. He states that the pain recently worsened 1 day ago.  He states that the pain is worse in his legs, shoulders and back. He states he has associated nausea and vomiting x2. He states that prednisone usually help controls his pain but is not providing relief now. He states that he has tapered down to 5 mg per day now. He states that he 15 mg a day of prednisone was helping with the pain previously. He states that he has tried Zofran with no relief tonight. He states that he has a constant low grade headache since being diagnosed with the pain. He states that he has being controlling his blood sugars since being on the prednisone by eating less than normal. He states he recently ran out his muscle relaxer this morning. He states he has been complaint with all other medications. He denies diarrhea or other related symptoms.   No fever, diarrhea or rash   Past Medical History  Diagnosis Date  . Asthma   . Hypertension   . Diabetes mellitus without complication   . Anxiety   . Kidney stones   . Crohn disease   . Thyroid disease    Past Surgical  History  Procedure Laterality Date  . Kidney stone surgery     History reviewed. No pertinent family history. History  Substance Use Topics  . Smoking status: Never Smoker   . Smokeless tobacco: Never Used  . Alcohol Use: Yes    Review of Systems  Gastrointestinal: Positive for nausea and vomiting. Negative for diarrhea.  Musculoskeletal: Positive for myalgias.   A complete 10 system review of systems was obtained and all systems are negative except as noted in the HPI and PMH.    Allergies  Celebrex  Home Medications   Prior to Admission medications   Medication Sig Start Date End Date Taking? Authorizing Provider  allopurinol (ZYLOPRIM) 100 MG tablet Take 100 mg by mouth daily.   Yes Historical Provider, MD  amLODipine (NORVASC) 5 MG tablet Take 5 mg by mouth daily.   Yes Historical Provider, MD  levothyroxine (SYNTHROID, LEVOTHROID) 75 MCG tablet Take 75 mcg by mouth daily before breakfast.   Yes Historical Provider, MD  orphenadrine (NORFLEX) 100 MG tablet Take 100 mg by mouth 2 (two) times daily.   Yes Historical Provider, MD  predniSONE (STERAPRED UNI-PAK) 5 MG TABS tablet Take by mouth.   Yes Historical Provider, MD  sitaGLIPtin-metformin (JANUMET) 50-1000 MG per tablet Take 1 tablet by mouth 2 (two) times daily with a meal.   Yes Historical Provider, MD  Cinnamon 500  MG capsule Take 500 mg by mouth daily.    Historical Provider, MD  GLIPIZIDE PO Take by mouth.    Historical Provider, MD  hydrochlorothiazide (HYDRODIURIL) 25 MG tablet Take 25 mg by mouth daily.    Historical Provider, MD  HYDROcodone-acetaminophen (NORCO/VICODIN) 5-325 MG per tablet Take 2 tablets by mouth every 4 (four) hours as needed. 06/29/13   Elson AreasLeslie K Sofia, PA-C  ibuprofen (ADVIL,MOTRIN) 800 MG tablet Take 1 tablet (800 mg total) by mouth 3 (three) times daily. 06/29/13   Elson AreasLeslie K Sofia, PA-C  LORazepam (ATIVAN) 1 MG tablet Take 1 tablet (1 mg total) by mouth 3 (three) times daily as needed for  anxiety. 06/09/12   Gwyneth SproutWhitney Jacqulyn Barresi, MD  metFORMIN (GLUCOPHAGE) 500 MG tablet Take 500 mg by mouth 2 (two) times daily with a meal.    Historical Provider, MD  Multiple Vitamin (MULTIVITAMIN) capsule Take 1 capsule by mouth daily.    Historical Provider, MD  oxyCODONE-acetaminophen (PERCOCET/ROXICET) 5-325 MG per tablet Take 1 tablet by mouth every 4 (four) hours as needed. 07/12/13   Kristen N Ward, DO   Triage Vitals: BP 143/85 mmHg  Pulse 90  Temp(Src) 97.8 F (36.6 C) (Oral)  Resp 16  Ht 5\' 10"  (1.778 m)  Wt 212 lb (96.163 kg)  BMI 30.42 kg/m2  SpO2 98%  Physical Exam  Constitutional: He is oriented to person, place, and time. He appears well-developed and well-nourished. No distress.  Appear anxious and uncomfortable.   HENT:  Head: Normocephalic and atraumatic.  Eyes: Conjunctivae and EOM are normal.  Neck: Neck supple. No tracheal deviation present.  Cardiovascular: Normal rate.   Pulmonary/Chest: Effort normal. No respiratory distress.  Musculoskeletal: Normal range of motion.  Diffuse generalized muscle pain with palpations   Neurological: He is alert and oriented to person, place, and time.  Skin: Skin is warm and dry.  Psychiatric: He has a normal mood and affect. His behavior is normal.  Nursing note and vitals reviewed.   ED Course  Procedures (including critical care time) DIAGNOSTIC STUDIES: Oxygen Saturation is 98% on RA, normal by my interpretation.    COORDINATION OF CARE: 11:52 PM-Discussed treatment plan which includes IV Fluids and pain medication with pt at bedside and pt agreed to plan.     Labs Review Labs Reviewed  CBC WITH DIFFERENTIAL  COMPREHENSIVE METABOLIC PANEL  CK  URINALYSIS, ROUTINE W REFLEX MICROSCOPIC    Imaging Review No results found.   EKG Interpretation None      MDM   Final diagnoses:  Myalgia   Patient with a prolonged history of myalgias that have been ongoing for 6 months. He is been seen by rheumatology and  initially diagnosed with polymyalgia and started on prednisone 15 mg per day which patient states significantly helped his pain however on a follow-up appointment he was told that he did not have polymyalgia and over the last few weeks has been on a prednisone taper which is caused his pain to again worsen. The pain is located in the muscles of his upper and lower extremities as well as his back. When the pain becomes severe it causes him to vomit. He states yesterday he started having exacerbation of his pain for an unknown reason. He denies any infectious symptoms associated with the pain such as fever, rash or diarrhea. He did have a tick bite prior to all of the starting 6 months ago but has been checked for Lyme disease and was negative. He does not use  IV drugs or drink excessive alcohol. He appears uncomfortable on exam but has normal vital signs.  Patient appears slightly dehydrated oral give IV fluids, pain and nausea medication.  CBC, CMP, CK, UA pending  1:32 AM Labs are within normal limits. Patient feels better after 4 mg of morphine. He'll be discharged home with pain and nausea medication. Also he will increase his prednisone back to 15 for the next week and then attempt to wean down to 10 again   I personally performed the services described in this documentation, which was scribed in my presence.  The recorded information has been reviewed and considered.      Gwyneth Sprout, MD 01/18/14 1610  Gwyneth Sprout, MD 01/18/14 9604  Gwyneth Sprout, MD 01/18/14 [phone]

## 2014-01-17 NOTE — ED Notes (Signed)
Pt c/o " muscle pain " x 6 months with n/v

## 2014-01-18 LAB — CBC WITH DIFFERENTIAL/PLATELET
BASOS ABS: 0 10*3/uL (ref 0.0–0.1)
Basophils Relative: 0 % (ref 0–1)
EOS PCT: 1 % (ref 0–5)
Eosinophils Absolute: 0.1 10*3/uL (ref 0.0–0.7)
HCT: 41.7 % (ref 39.0–52.0)
Hemoglobin: 15 g/dL (ref 13.0–17.0)
LYMPHS PCT: 22 % (ref 12–46)
Lymphs Abs: 2.4 10*3/uL (ref 0.7–4.0)
MCH: 31.4 pg (ref 26.0–34.0)
MCHC: 36 g/dL (ref 30.0–36.0)
MCV: 87.4 fL (ref 78.0–100.0)
Monocytes Absolute: 1.1 10*3/uL — ABNORMAL HIGH (ref 0.1–1.0)
Monocytes Relative: 10 % (ref 3–12)
Neutro Abs: 7.4 10*3/uL (ref 1.7–7.7)
Neutrophils Relative %: 67 % (ref 43–77)
Platelets: 293 10*3/uL (ref 150–400)
RBC: 4.77 MIL/uL (ref 4.22–5.81)
RDW: 12.5 % (ref 11.5–15.5)
WBC: 10.9 10*3/uL — ABNORMAL HIGH (ref 4.0–10.5)

## 2014-01-18 LAB — COMPREHENSIVE METABOLIC PANEL
ALT: 30 U/L (ref 0–53)
AST: 21 U/L (ref 0–37)
Albumin: 3.6 g/dL (ref 3.5–5.2)
Alkaline Phosphatase: 59 U/L (ref 39–117)
Anion gap: 17 — ABNORMAL HIGH (ref 5–15)
BUN: 21 mg/dL (ref 6–23)
CO2: 24 meq/L (ref 19–32)
Calcium: 10.4 mg/dL (ref 8.4–10.5)
Chloride: 96 mEq/L (ref 96–112)
Creatinine, Ser: 0.9 mg/dL (ref 0.50–1.35)
GFR calc Af Amer: >90 mL/min (ref >90)
GFR calc non Af Amer: >90 mL/min (ref >90)
Glucose, Bld: 166 mg/dL — ABNORMAL HIGH (ref 70–99)
Potassium: 3.9 mEq/L (ref 3.7–5.3)
Sodium: 137 mEq/L (ref 137–147)
TOTAL PROTEIN: 7.1 g/dL (ref 6.0–8.3)
Total Bilirubin: 0.4 mg/dL (ref 0.3–1.2)

## 2014-01-18 LAB — CK: Total CK: 62 U/L (ref 7–232)

## 2014-01-18 MED ORDER — PREDNISONE 5 MG PO TABS: 15.0000 mg | ORAL_TABLET | Freq: Every day | ORAL | Status: DC

## 2014-01-18 MED ORDER — HYDROCODONE-ACETAMINOPHEN 5-325 MG PO TABS: 1.0000 | ORAL_TABLET | Freq: Four times a day (QID) | ORAL | Status: DC | PRN

## 2014-01-18 MED ORDER — HYDROCODONE-ACETAMINOPHEN 5-325 MG PO TABS
2.0000 | ORAL_TABLET | Freq: Once | ORAL | Status: AC
  Administered 2014-01-18: 2 via ORAL
  Filled 2014-01-18: qty 2

## 2014-01-20 ENCOUNTER — Encounter (HOSPITAL_BASED_OUTPATIENT_CLINIC_OR_DEPARTMENT_OTHER): Payer: Self-pay | Admitting: Emergency Medicine

## 2014-01-20 ENCOUNTER — Emergency Department (HOSPITAL_BASED_OUTPATIENT_CLINIC_OR_DEPARTMENT_OTHER)
Admission: EM | Admit: 2014-01-20 | Discharge: 2014-01-20 | Disposition: A | Discharge status: Home / Self Care | Payer: Medicaid Other | Attending: Emergency Medicine | Admitting: Emergency Medicine

## 2014-01-20 DIAGNOSIS — J45909 Unspecified asthma, uncomplicated: Secondary | ICD-10-CM | POA: Insufficient documentation

## 2014-01-20 DIAGNOSIS — F419 Anxiety disorder, unspecified: Secondary | ICD-10-CM | POA: Diagnosis not present

## 2014-01-20 DIAGNOSIS — Z7952 Long term (current) use of systemic steroids: Secondary | ICD-10-CM | POA: Diagnosis not present

## 2014-01-20 DIAGNOSIS — R251 Tremor, unspecified: Secondary | ICD-10-CM | POA: Diagnosis not present

## 2014-01-20 DIAGNOSIS — Z79899 Other long term (current) drug therapy: Secondary | ICD-10-CM | POA: Diagnosis not present

## 2014-01-20 DIAGNOSIS — R1310 Dysphagia, unspecified: Secondary | ICD-10-CM | POA: Insufficient documentation

## 2014-01-20 DIAGNOSIS — E119 Type 2 diabetes mellitus without complications: Secondary | ICD-10-CM | POA: Insufficient documentation

## 2014-01-20 DIAGNOSIS — M791 Myalgia, unspecified site: Secondary | ICD-10-CM

## 2014-01-20 DIAGNOSIS — E079 Disorder of thyroid, unspecified: Secondary | ICD-10-CM | POA: Insufficient documentation

## 2014-01-20 DIAGNOSIS — Z87442 Personal history of urinary calculi: Secondary | ICD-10-CM | POA: Diagnosis not present

## 2014-01-20 DIAGNOSIS — Z8719 Personal history of other diseases of the digestive system: Secondary | ICD-10-CM | POA: Diagnosis not present

## 2014-01-20 DIAGNOSIS — I10 Essential (primary) hypertension: Secondary | ICD-10-CM | POA: Diagnosis not present

## 2014-01-20 MED ORDER — METHYLPREDNISOLONE SODIUM SUCC 125 MG IJ SOLR
125.0000 mg | Freq: Once | INTRAMUSCULAR | Status: AC
  Administered 2014-01-20: 125 mg via INTRAMUSCULAR
  Filled 2014-01-20: qty 2

## 2014-01-20 MED ORDER — HYDROMORPHONE HCL 1 MG/ML IJ SOLN
2.0000 mg | Freq: Once | INTRAMUSCULAR | Status: AC
  Administered 2014-01-20: 2 mg via INTRAMUSCULAR
  Filled 2014-01-20: qty 2

## 2014-01-20 NOTE — ED Notes (Signed)
Pt assistant out front by charge RN, pt very appreciative of time that this RN took and MD took in assisting with finding a new md, resources provided for local primary physician. I sat down with patient in room and discussed his care and listened to bad experiences he claims he has had prior to visit. Encouraged pt to get a notebook and keep track of providers he has seen and to help him track his care. At discharge pt was assisted up front to wait for cab by charge RN and Primary RN, very appreciative of care.

## 2014-01-20 NOTE — ED Provider Notes (Addendum)
CSN: [id number]     Arrival date & time 01/20/14  0123 History   First MD Initiated Contact with Patient 01/20/14 0405     Chief Complaint  Patient presents with  . Muscle Pain     (Consider location/radiation/quality/duration/timing/severity/associated sxs/prior Treatment) HPI This is a 53 year old male with a bout a 6 month history of generalized muscle pain, tremor and dysphagia. He has been seen on multiple occasions by multiple physicians. He has been diagnosed with polymyalgia rheumatica and then later told he didn't have polymyalgia rheumatica. He is also been told he had fibromyalgia. He gets relief with steroids and was seen several days ago by Dr. Anitra LauthPlunkett who placed him on a steroid taper and hydrocodone. His lab work at that time was unremarkable with no evidence of rhabdomyolysis.  He states he has taken 4 hydrocodone tablets since then. He is here this morning with an exacerbation of the pain which he states is severe. It is located in all of his muscles. It is accompanied by worsened tremor and dysphagia. He is having to drink larger quantities of liquid in order to swallow his food. He is requesting acute pain relief and referral to a primary care physician "who cares" as he feels he has not been taken seriously by other physicians.  Past Medical History  Diagnosis Date  . Asthma   . Hypertension   . Diabetes mellitus without complication   . Anxiety   . Kidney stones   . Crohn disease   . Thyroid disease    Past Surgical History  Procedure Laterality Date  . Kidney stone surgery     History reviewed. No pertinent family history. History  Substance Use Topics  . Smoking status: Never Smoker   . Smokeless tobacco: Never Used  . Alcohol Use: Yes    Review of Systems  All other systems reviewed and are negative.   Allergies  Celebrex and Cymbalta  Home Medications   Prior to Admission medications   Medication Sig Start Date End Date Taking? Authorizing  Provider  HYDROcodone-acetaminophen (NORCO/VICODIN) 5-325 MG per tablet Take 1-2 tablets by mouth every 6 (six) hours as needed for moderate pain or severe pain. 01/18/14  Yes Gwyneth SproutWhitney Plunkett, MD  allopurinol (ZYLOPRIM) 100 MG tablet Take 100 mg by mouth daily.    Historical Provider, MD  amLODipine (NORVASC) 5 MG tablet Take 5 mg by mouth daily.    Historical Provider, MD  Cinnamon 500 MG capsule Take 500 mg by mouth daily.    Historical Provider, MD  GLIPIZIDE PO Take by mouth.    Historical Provider, MD  hydrochlorothiazide (HYDRODIURIL) 25 MG tablet Take 25 mg by mouth daily.    Historical Provider, MD  ibuprofen (ADVIL,MOTRIN) 800 MG tablet Take 1 tablet (800 mg total) by mouth 3 (three) times daily. 06/29/13   Elson AreasLeslie K Sofia, PA-C  levothyroxine (SYNTHROID, LEVOTHROID) 75 MCG tablet Take 75 mcg by mouth daily before breakfast.    Historical Provider, MD  LORazepam (ATIVAN) 1 MG tablet Take 1 tablet (1 mg total) by mouth 3 (three) times daily as needed for anxiety. 06/09/12   Gwyneth SproutWhitney Plunkett, MD  metFORMIN (GLUCOPHAGE) 500 MG tablet Take 500 mg by mouth 2 (two) times daily with a meal.    Historical Provider, MD  Multiple Vitamin (MULTIVITAMIN) capsule Take 1 capsule by mouth daily.    Historical Provider, MD  orphenadrine (NORFLEX) 100 MG tablet Take 100 mg by mouth 2 (two) times daily.    Historical Provider, MD  oxyCODONE-acetaminophen (PERCOCET/ROXICET) 5-325 MG per tablet Take 1 tablet by mouth every 4 (four) hours as needed. 07/12/13   Kristen N Ward, DO  predniSONE (DELTASONE) 5 MG tablet Take 3 tablets (15 mg total) by mouth daily. 01/18/14   Gwyneth SproutWhitney Plunkett, MD  sitaGLIPtin-metformin (JANUMET) 50-1000 MG per tablet Take 1 tablet by mouth 2 (two) times daily with a meal.    Historical Provider, MD   BP 128/92 mmHg  Pulse 122  Temp(Src) 98.7 F (37.1 C) (Oral)  Resp 20  Ht 5\' 10"  (1.778 m)  Wt 215 lb (97.523 kg)  BMI 30.85 kg/m2  SpO2 99%   Physical Exam  General:  Well-developed, well-nourished male in no acute distress; appearance consistent with age of record HENT: normocephalic; atraumatic Eyes: pupils equal, round and reactive to light; extraocular muscles intact Neck: supple Heart: regular rate and rhythm Lungs: clear to auscultation bilaterally Abdomen: soft; nondistended; nontender; bowel sounds present Extremities: No deformity; full range of motion Neurologic: Awake, alert and oriented; motor function intact in all extremities and symmetric; no facial droop; generalized tremor Skin: Warm and dry Psychiatric: anxious; pressured speech    ED Course  Procedures (including critical care time)   MDM  The patient's presentation is consistent with prior visits per nursing staff familiar with him. We have provided him referrals to [facility] primary care physicians. He recently obtained Medicaid retroactive to April 1.   Hanley SeamenJohn L Madison Albea, MD 01/20/14 0444  Hanley SeamenJohn L Bianka Liberati, MD 01/20/14 [phone]

## 2014-01-20 NOTE — ED Notes (Signed)
Pt with muscle pain x 6 months, no change just pain has increased tonight

## 2014-04-03 ENCOUNTER — Emergency Department (HOSPITAL_BASED_OUTPATIENT_CLINIC_OR_DEPARTMENT_OTHER)
Admission: EM | Admit: 2014-04-03 | Discharge: 2014-04-03 | Disposition: A | Discharge status: Home / Self Care | Payer: Medicaid Other | Attending: Emergency Medicine | Admitting: Emergency Medicine

## 2014-04-03 ENCOUNTER — Encounter (HOSPITAL_BASED_OUTPATIENT_CLINIC_OR_DEPARTMENT_OTHER): Payer: Self-pay | Admitting: *Deleted

## 2014-04-03 DIAGNOSIS — Z87442 Personal history of urinary calculi: Secondary | ICD-10-CM | POA: Diagnosis not present

## 2014-04-03 DIAGNOSIS — N508 Other specified disorders of male genital organs: Secondary | ICD-10-CM | POA: Insufficient documentation

## 2014-04-03 DIAGNOSIS — Z79899 Other long term (current) drug therapy: Secondary | ICD-10-CM | POA: Diagnosis not present

## 2014-04-03 DIAGNOSIS — M791 Myalgia: Secondary | ICD-10-CM | POA: Diagnosis not present

## 2014-04-03 DIAGNOSIS — I10 Essential (primary) hypertension: Secondary | ICD-10-CM | POA: Diagnosis not present

## 2014-04-03 DIAGNOSIS — J45909 Unspecified asthma, uncomplicated: Secondary | ICD-10-CM | POA: Diagnosis not present

## 2014-04-03 DIAGNOSIS — R109 Unspecified abdominal pain: Secondary | ICD-10-CM | POA: Diagnosis present

## 2014-04-03 DIAGNOSIS — E079 Disorder of thyroid, unspecified: Secondary | ICD-10-CM | POA: Insufficient documentation

## 2014-04-03 DIAGNOSIS — E119 Type 2 diabetes mellitus without complications: Secondary | ICD-10-CM | POA: Diagnosis not present

## 2014-04-03 DIAGNOSIS — K5901 Slow transit constipation: Secondary | ICD-10-CM

## 2014-04-03 DIAGNOSIS — K5641 Fecal impaction: Secondary | ICD-10-CM | POA: Diagnosis not present

## 2014-04-03 NOTE — ED Notes (Signed)
MD at bedside. 

## 2014-04-03 NOTE — ED Notes (Signed)
Abdominal pain. Constipation.

## 2014-04-03 NOTE — Discharge Instructions (Signed)
Abdominal Pain °Many things can cause abdominal pain. Usually, abdominal pain is not caused by a disease and will improve without treatment. It can often be observed and treated at home. Your health care provider will do a physical exam and possibly order blood tests and X-rays to help determine the seriousness of your pain. However, in many cases, more time must pass before a clear cause of the pain can be found. Before that point, your health care provider may not know if you need more testing or further treatment. °HOME CARE INSTRUCTIONS  °Monitor your abdominal pain for any changes. The following actions may help to alleviate any discomfort you are experiencing: °· Only take over-the-counter or prescription medicines as directed by your health care provider. °· Do not take laxatives unless directed to do so by your health care provider. °· Try a clear liquid diet (broth, tea, or water) as directed by your health care provider. Slowly move to a bland diet as tolerated. °SEEK MEDICAL CARE IF: °· You have unexplained abdominal pain. °· You have abdominal pain associated with nausea or diarrhea. °· You have pain when you urinate or have a bowel movement. °· You experience abdominal pain that wakes you in the night. °· You have abdominal pain that is worsened or improved by eating food. °· You have abdominal pain that is worsened with eating fatty foods. °· You have a fever. °SEEK IMMEDIATE MEDICAL CARE IF:  °· Your pain does not go away within 2 hours. °· You keep throwing up (vomiting). °· Your pain is felt only in portions of the abdomen, such as the right side or the left lower portion of the abdomen. °· You pass bloody or black tarry stools. °MAKE SURE YOU: °· Understand these instructions.   °· Will watch your condition.   °· Will get help right away if you are not doing well or get worse.   °Document Released: 12/11/2004 Document Revised: 03/08/2013 Document Reviewed: 11/10/2012 °[employer]® Patient Information  ©2015 [employer]. This information is not intended to replace advice given to you by your health care provider. Make sure you discuss any questions you have with your health care provider. ° °Constipation °Constipation is when a person has fewer than three bowel movements a week, has difficulty having a bowel movement, or has stools that are dry, hard, or larger than normal. As people grow older, constipation is more common. If you try to fix constipation with medicines that make you have a bowel movement (laxatives), the problem may get worse. Long-term laxative use may cause the muscles of the colon to become weak. A low-fiber diet, not taking in enough fluids, and taking certain medicines may make constipation worse.  °CAUSES  °· Certain medicines, such as antidepressants, pain medicine, iron supplements, antacids, and water pills.   °· Certain diseases, such as diabetes, irritable bowel syndrome (IBS), thyroid disease, or depression.   °· Not drinking enough water.   °· Not eating enough fiber-rich foods.   °· Stress or travel.   °· Lack of physical activity or exercise.   °· Ignoring the urge to have a bowel movement.   °· Using laxatives too much.   °SIGNS AND SYMPTOMS  °· Having fewer than three bowel movements a week.   °· Straining to have a bowel movement.   °· Having stools that are hard, dry, or larger than normal.   °· Feeling full or bloated.   °· Pain in the lower abdomen.   °· Not feeling relief after having a bowel movement.   °DIAGNOSIS  °Your health care provider will take   a medical history and perform a physical exam. Further testing may be done for severe constipation. Some tests may include: °· A barium enema X-ray to examine your rectum, colon, and, sometimes, your small intestine.   °· A sigmoidoscopy to examine your lower colon.   °· A colonoscopy to examine your entire colon. °TREATMENT  °Treatment will depend on the severity of your constipation and what is causing it. Some dietary  treatments include drinking more fluids and eating more fiber-rich foods. Lifestyle treatments may include regular exercise. If these diet and lifestyle recommendations do not help, your health care provider may recommend taking over-the-counter laxative medicines to help you have bowel movements. Prescription medicines may be prescribed if over-the-counter medicines do not work.  °HOME CARE INSTRUCTIONS  °· Eat foods that have a lot of fiber, such as fruits, vegetables, whole grains, and beans. °· Limit foods high in fat and processed sugars, such as french fries, hamburgers, cookies, candies, and soda.   °· A fiber supplement may be added to your diet if you cannot get enough fiber from foods.   °· Drink enough fluids to keep your urine clear or pale yellow.   °· Exercise regularly or as directed by your health care provider.   °· Go to the restroom when you have the urge to go. Do not hold it.   °· Only take over-the-counter or prescription medicines as directed by your health care provider. Do not take other medicines for constipation without talking to your health care provider first.   °SEEK IMMEDIATE MEDICAL CARE IF:  °· You have bright red blood in your stool.   °· Your constipation lasts for more than 4 days or gets worse.   °· You have abdominal or rectal pain.   °· You have thin, pencil-like stools.   °· You have unexplained weight loss. °MAKE SURE YOU:  °· Understand these instructions. °· Will watch your condition. °· Will get help right away if you are not doing well or get worse. °Document Released: 11/30/2003 Document Revised: 03/08/2013 Document Reviewed: 12/13/2012 °[employer]® Patient Information ©2015 [employer]. This information is not intended to replace advice given to you by your health care provider. Make sure you discuss any questions you have with your health care provider. ° °

## 2014-04-03 NOTE — ED Provider Notes (Signed)
CSN: [id number]     Arrival date & time 04/03/14  1913 History  This chart was scribed for Mirian MoMatthew Gentry, MD by Abel PrestoKara Demonbreun, ED Scribe. This patient was seen in room MH05/MH05 and the patient's care was started at 8:51 PM.    Chief Complaint  Patient presents with  . Abdominal Pain     Patient is a 54 y.o. male presenting with abdominal pain. The history is provided by the patient. No language interpreter was used.  Abdominal Pain Associated symptoms: constipation and nausea (baseline)   Associated symptoms: no dysuria, no fever and no hematuria     HPI Comments: Nathan Durham is a 54 y.o. male with PMHx of HTN, DM, nephrolithiasis, thyroid disease, Crohn's disease, and asthma who presents to the Emergency Department complaining of abdominal pain with onset around 6 PM today. Pt notes associated constipation, groin pain, and back pain. Pt states he feels like he can go but has been unable to. Pt notes last normal bowel movement was 4 days ago. Pt is taking Norflex, Janumet, Lantus, HCTZ, hydrocodone, and oxycodone. Pt has taken Colace for relief. Pt notes he has had decreased bowel movements and chronic myalgias and arthralgias for 8 months. Pt was told he may have Lyme's disease. Pt denies fever, nausea different than baseline, fecal incontinence, dysuria, hematuria, hematochezia.  Past Medical History  Diagnosis Date  . Asthma   . Hypertension   . Diabetes mellitus without complication   . Anxiety   . Kidney stones   . Crohn disease   . Thyroid disease    Past Surgical History  Procedure Laterality Date  . Kidney stone surgery     No family history on file. History  Substance Use Topics  . Smoking status: Never Smoker   . Smokeless tobacco: Never Used  . Alcohol Use: Yes    Review of Systems  Constitutional: Negative for fever.  Gastrointestinal: Positive for nausea (baseline), abdominal pain and constipation. Negative for blood in stool.  Genitourinary: Positive  for testicular pain. Negative for dysuria and hematuria.  Musculoskeletal: Positive for myalgias (baseline), back pain and arthralgias (baseline).  All other systems reviewed and are negative.     Allergies  Celebrex and Cymbalta  Home Medications   Prior to Admission medications   Medication Sig Start Date End Date Taking? Authorizing Provider  Oxycodone-Acetaminophen (PERCOCET PO) Take by mouth.   Yes Historical Provider, MD  allopurinol (ZYLOPRIM) 100 MG tablet Take 100 mg by mouth daily.    Historical Provider, MD  amLODipine (NORVASC) 5 MG tablet Take 5 mg by mouth daily.    Historical Provider, MD  Cinnamon 500 MG capsule Take 500 mg by mouth daily.    Historical Provider, MD  GLIPIZIDE PO Take by mouth.    Historical Provider, MD  hydrochlorothiazide (HYDRODIURIL) 25 MG tablet Take 25 mg by mouth daily.    Historical Provider, MD  HYDROcodone-acetaminophen (NORCO/VICODIN) 5-325 MG per tablet Take 1-2 tablets by mouth every 6 (six) hours as needed for moderate pain or severe pain. 01/18/14   Gwyneth SproutWhitney Plunkett, MD  levothyroxine (SYNTHROID, LEVOTHROID) 75 MCG tablet Take 75 mcg by mouth daily before breakfast.    Historical Provider, MD  metFORMIN (GLUCOPHAGE) 500 MG tablet Take 500 mg by mouth 2 (two) times daily with a meal.    Historical Provider, MD  Multiple Vitamin (MULTIVITAMIN) capsule Take 1 capsule by mouth daily.    Historical Provider, MD  orphenadrine (NORFLEX) 100 MG tablet Take 100 mg by mouth  2 (two) times daily.    Historical Provider, MD  sitaGLIPtin-metformin (JANUMET) 50-1000 MG per tablet Take 1 tablet by mouth 2 (two) times daily with a meal.    Historical Provider, MD   BP 131/81 mmHg  Pulse 98  Temp(Src) 98.7 F (37.1 C) (Oral)  Resp 18  Ht  (1.778 m)  Wt 215 lb (97.523 kg)  BMI 30.85 kg/m2  SpO2 98% Physical Exam  Constitutional: He is oriented to person, place, and time. He appears well-developed and well-nourished.  HENT:  Head: Normocephalic  and atraumatic.  Eyes: Conjunctivae and EOM are normal.  Neck: Normal range of motion. Neck supple.  Cardiovascular: Normal rate, regular rhythm and normal heart sounds.   Pulmonary/Chest: Effort normal and breath sounds normal. No respiratory distress.  Abdominal: He exhibits no distension. There is no tenderness. There is no rebound and no guarding.  Genitourinary: Rectal exam shows external hemorrhoid. Rectal exam shows no internal hemorrhoid. Prostate is not enlarged and not tender.  Musculoskeletal: Normal range of motion.  Neurological: He is alert and oriented to person, place, and time.  Skin: Skin is warm and dry.  Vitals reviewed.   ED Course  Fecal disimpaction Date/Time: 04/04/2014 4:17 PM Performed by: Mirian Mo Authorized by: Mirian Mo Consent: Verbal consent obtained. Local anesthesia used: no Patient sedated: no Patient tolerance: Patient tolerated the procedure well with no immediate complications   (including critical care time) DIAGNOSTIC STUDIES: Oxygen Saturation is 100% on room air, normal by my interpretation.    COORDINATION OF CARE: 9:02 PM Discussed treatment plan with patient at beside including Miralax and enemas, the patient agrees with the plan and has no further questions at this time.   Labs Review Labs Reviewed - No data to display  Imaging Review No results found.   EKG Interpretation None      MDM   Final diagnoses:  Fecal impaction in rectum  Abdominal pain, acute  Slow transit constipation   54 y.o. male with pertinent PMH of asthma, HTN, DM, prior nephrolithiasis presents with constipation, recurrent abdominal pain. Patient denies upper GI symptoms that are new from baseline history. He also has baseline abdominal pain which she states is unchanged from his daily level. Physical exam as above, with obvious fecal matter at anal verge. Digitally disimpacted.  Standard return precautions for constipation, including any  worsening upper gi symptoms, given.  DC home in stable condition.    I have reviewed all laboratory and imaging studies if ordered as above  1. Fecal impaction in rectum   2. Abdominal pain, acute   3. Slow transit constipation          Mirian Mo, MD 04/04/14 1620

## 2014-04-13 ENCOUNTER — Encounter: Payer: Self-pay | Admitting: Neurology

## 2014-04-13 ENCOUNTER — Ambulatory Visit (INDEPENDENT_AMBULATORY_CARE_PROVIDER_SITE_OTHER): Payer: Medicaid Other | Admitting: Neurology

## 2014-04-13 ENCOUNTER — Other Ambulatory Visit: Payer: Self-pay | Admitting: Neurology

## 2014-04-13 VITALS — BP 161/83 | HR 85 | Ht 70.0 in | Wt 211.0 lb

## 2014-04-13 DIAGNOSIS — M791 Myalgia, unspecified site: Secondary | ICD-10-CM | POA: Insufficient documentation

## 2014-04-13 DIAGNOSIS — K5901 Slow transit constipation: Secondary | ICD-10-CM | POA: Insufficient documentation

## 2014-04-13 DIAGNOSIS — R52 Pain, unspecified: Secondary | ICD-10-CM

## 2014-04-13 DIAGNOSIS — R269 Unspecified abnormalities of gait and mobility: Secondary | ICD-10-CM

## 2014-04-13 DIAGNOSIS — M542 Cervicalgia: Secondary | ICD-10-CM | POA: Insufficient documentation

## 2014-04-13 DIAGNOSIS — IMO0002 Reserved for concepts with insufficient information to code with codable children: Secondary | ICD-10-CM

## 2014-04-13 HISTORY — DX: Unspecified abnormalities of gait and mobility: R26.9

## 2014-04-13 HISTORY — DX: Slow transit constipation: K59.01

## 2014-04-13 HISTORY — DX: Cervicalgia: M54.2

## 2014-04-13 NOTE — Progress Notes (Signed)
PATIENT: Nathan Durham DOB: 1960-04-21  HISTORICAL  Nathan Durham is of 54 yo right-handed male, referred by his primary care PA Laurence Aly for evaluation of diffuse body aching pain  He was highly function prior to April 2015, does physical jobs, to have history of diabetes, hypertension  Early April 2015, he took out a tick  from his right ear, a week later, he noticed diffuse body ache you pain, muscle cramping, twitching, extreme fatigue, headaches, actually presented to [facility] emergency room, laboratory showed normal CBC, CPK 99, CMP, with exception of elevated glucose 200, normal UA  Ever since the symptom onset, he complains of extreme fatigue, tiredness, body achy pain, could no longer go back to his work, at its worst, he has difficulty swallowing, choking episodes, over the past few months, he was seen by different specialties, including his primary care physician at [facility] medical center, rheumatologist Dr. Rossie Muskrat at [facility].  He reported extensive laboratory evaluations, no etiology found, recently laboratory showed equivocal Lyme titer, he is referred to infectious disease specialist at the [facility] health in Feb 8th 2016. He also reported normal EMG nerve conduction, laboratory evaluations, extensive cardiac evaluations,  Based on his symptoms, he was diagnosed with fibromyalgia, was started on prednisone around June 2015, which has been very helpful, he was able to swallowing better, after tapering off, he had recurrent swallowing difficulty, whole body achy pain, which again was under control after restarting prednisone 15 mg daily, recent 1 month, since December 2015, he also developed diffuse joint pain,  Laboratory evaluation from the [facility] health rheumatology and arthritis, Lyme titers showed positive IgG P 45, P 41, P23, negative Lyme IgM, negative ANA with reflexes, uric acid 6.4,  Repeat laboratory at [facility] November 2015,  CPK 66, normal CMP, CBC.  EMG nerve conduction study in March 15 2014, at [facility], showed normal left upper, lower extremity sensory and motor conduction study, normal left upper extremity EMG  He also reported extensive laboratory evaluations, at his primary care [facility]   He denies visual change, mild gait difficulty, recent worsening constipation   REVIEW OF SYSTEMS: Full 14 system review of systems performed and notable only for fatigue, chest pain, trouble swallowing, constipation, joint pain, cramps, aching muscles, headaches, weakness, difficulty swallowing,   ALLERGIES: Allergies  Allergen Reactions  . Cymbalta [Duloxetine Hcl] Nausea And Vomiting  . Celebrex [Celecoxib] Palpitations    Chest tight-     HOME MEDICATIONS: Current Outpatient Prescriptions  Medication Sig Dispense Refill  . allopurinol (ZYLOPRIM) 100 MG tablet Take 100 mg by mouth daily.    Marland Kitchen amLODipine (NORVASC) 5 MG tablet Take 5 mg by mouth daily.    . Cinnamon 500 MG capsule Take 500 mg by mouth daily.    Marland Kitchen GLIPIZIDE PO Take by mouth.    . hydrochlorothiazide (HYDRODIURIL) 25 MG tablet Take 25 mg by mouth daily.    Marland Kitchen HYDROcodone-acetaminophen (NORCO/VICODIN) 5-325 MG per tablet Take 1-2 tablets by mouth every 6 (six) hours as needed for moderate pain or severe pain. 15 tablet 0  . levothyroxine (SYNTHROID, LEVOTHROID) 75 MCG tablet Take 75 mcg by mouth daily before breakfast.    . metFORMIN (GLUCOPHAGE) 500 MG tablet Take 500 mg by mouth 2 (two) times daily with a meal.    . Multiple Vitamin (MULTIVITAMIN) capsule Take 1 capsule by mouth daily.    . orphenadrine (NORFLEX) 100 MG tablet Take 100 mg by  mouth 2 (two) times daily.    . Oxycodone-Acetaminophen (PERCOCET PO) Take by mouth.    . sitaGLIPtin-metformin (JANUMET) 50-1000 MG per tablet Take 1 tablet by mouth 2 (two) times daily with a meal.     No current facility-administered medications for this  visit.    PAST MEDICAL HISTORY: Past Medical History  Diagnosis Date  . Asthma   . Hypertension   . Diabetes mellitus without complication   . Anxiety   . Kidney stones   . Crohn disease   . Thyroid disease   . Hyperlipidemia   . Myalgia   . Stenosis of carotid artery   . Cervical spondylosis   . Hypomagnesemia   . Depression     PAST SURGICAL HISTORY: Past Surgical History  Procedure Laterality Date  . Kidney stone surgery      FAMILY HISTORY: Family History  Problem Relation Age of Onset  . Arthritis Mother     Living    SOCIAL HISTORY:  History   Social History  . Marital Status: Legally Separated    Spouse Name: N/A    Number of Children: 1  . Years of Education: 12 years   Occupational History  . Disabled since April 2015,     Social History Main Topics  . Smoking status: Never Smoker   . Smokeless tobacco: Never Used  . Alcohol Use: 0.0 oz/week    0 Not specified per week     Comment: Occasionally  . Drug Use: No  . Sexual Activity: Not on file   Other Topics Concern  . Not on file   Social History Narrative   Live with parents.   1-2 cups/daily   Right-handed     PHYSICAL EXAM   Filed Vitals:   04/13/14 0849  BP: 161/83  Pulse: 85  Height: 5\' 10"  (1.778 m)  Weight: 211 lb (95.709 kg)   Body mass index is 30.28 kg/(m^2).   Generalized: In no acute distress  Neck: Supple, no carotid bruits   Cardiac: Regular rate rhythm  Pulmonary: Clear to auscultation bilaterally  Musculoskeletal: No deformity  Neurological examination  Mentation: Alert oriented to time, place, history taking, and causual conversation  Cranial nerve II-XII: Pupils were equal round reactive to light. Extraocular movements were full.  Visual field were full on confrontational test. Bilateral fundi were sharp.  Facial sensation and strength were normal. Hearing was intact to finger rubbing bilaterally. Uvula tongue midline.  Head turning and shoulder  shrug and were normal and symmetric.Tongue protrusion into cheek strength was normal.  Motor: Normal tone, bulk and strength.  Sensory: Intact to fine touch, pinprick, preserved vibratory sensation, and proprioception at toes.  Coordination: Normal finger to nose, heel-to-shin bilaterally there was no truncal ataxia  Gait: Rising up from seated position without assistance, normal stance, without trunk ataxia, moderate stride, good arm swing, smooth turning, able to perform tiptoe, and heel walking without difficulty.   Romberg signs: Negative  Deep tendon reflexes: Brachioradialis 2/2, biceps 2/2, triceps 2/2, patellar 3/3, Achilles 2/2, plantar responses were flexor bilaterally.   DIAGNOSTIC DATA (LABS, IMAGING, TESTING) - I reviewed patient records, labs, notes, testing and imaging myself where available.  Lab Results  Component Value Date   WBC 10.9* 01/17/2014   HGB 15.0 01/17/2014   HCT 41.7 01/17/2014   MCV 87.4 01/17/2014   PLT 293 01/17/2014      Component Value Date/Time   NA 137 01/17/2014 2359   K 3.9 01/17/2014 2359  CL 96 01/17/2014 2359   CO2 24 01/17/2014 2359   GLUCOSE 166* 01/17/2014 2359   BUN 21 01/17/2014 2359   CREATININE 0.90 01/17/2014 2359   CALCIUM 10.4 01/17/2014 2359   PROT 7.1 01/17/2014 2359   ALBUMIN 3.6 01/17/2014 2359   AST 21 01/17/2014 2359   ALT 30 01/17/2014 2359   ALKPHOS 59 01/17/2014 2359   BILITOT 0.4 01/17/2014 2359   GFRNONAA >90 01/17/2014 2359   GFRAA >90 01/17/2014 2359   ASSESSMENT AND PLAN  Nathan Durham is a 54 y.o. male  with rapid onset of whole body pain, fatigue, since April 2015, following tick bite, with positive Lyme titer IgG, P 45, 41, 23,, good response to prednisone, on examination, he has hyperreflexia, complains mild gait difficulty, constipation,   1, MRI of cervical spine to rule out cervical cord lesion  2.  Follow-up with his infectious disease appointment February eighth 2016  3. return to clinic  in 3-4 weeks   Orders Placed This Encounter  Procedures  . MR Cervical Spine Wo Contrast    Return in about 1 month (around 05/14/2014).    Levert Feinstein, M.D. Ph.D.  [facility] [address], [address] [place], [place] [address] [phone]

## 2014-04-20 ENCOUNTER — Encounter: Payer: Self-pay | Admitting: Neurology

## 2014-04-28 ENCOUNTER — Ambulatory Visit
Admission: RE | Admit: 2014-04-28 | Discharge: 2014-04-28 | Disposition: A | Discharge status: Home / Self Care | Payer: Medicaid Other | Source: Ambulatory Visit | Attending: Neurology | Admitting: Neurology

## 2014-04-28 ENCOUNTER — Other Ambulatory Visit: Payer: Medicaid Other

## 2014-04-28 DIAGNOSIS — R269 Unspecified abnormalities of gait and mobility: Secondary | ICD-10-CM

## 2014-04-28 DIAGNOSIS — M791 Myalgia, unspecified site: Secondary | ICD-10-CM

## 2014-04-28 DIAGNOSIS — M542 Cervicalgia: Secondary | ICD-10-CM

## 2014-04-28 DIAGNOSIS — IMO0002 Reserved for concepts with insufficient information to code with codable children: Secondary | ICD-10-CM

## 2014-04-28 DIAGNOSIS — R52 Pain, unspecified: Secondary | ICD-10-CM

## 2014-04-28 DIAGNOSIS — K5901 Slow transit constipation: Secondary | ICD-10-CM

## 2014-05-02 ENCOUNTER — Telehealth: Payer: Self-pay | Admitting: Neurology

## 2014-05-02 DIAGNOSIS — M5010 Cervical disc disorder with radiculopathy, unspecified cervical region: Secondary | ICD-10-CM

## 2014-05-02 DIAGNOSIS — R269 Unspecified abnormalities of gait and mobility: Secondary | ICD-10-CM

## 2014-05-02 DIAGNOSIS — M542 Cervicalgia: Secondary | ICD-10-CM

## 2014-05-02 HISTORY — DX: Cervical disc disorder with radiculopathy, unspecified cervical region: M50.10

## 2014-05-02 NOTE — Telephone Encounter (Signed)
He is aware of MRI results. Reports having cervical pain that radiates into shoulders and arms.  Feels left side is worse than right.  He is agreeable to the EMG/NCV study.

## 2014-05-02 NOTE — Telephone Encounter (Signed)
Order placed, let him know the EMG/NCS date.

## 2014-05-02 NOTE — Telephone Encounter (Signed)
Michelle: Please call patient, MRI of cervical spine showed left C6 and 7 herniated disc,  I asked him to see if he has any symptoms suggestive of left cervical radiculopathy, such as left-sided neck pain, radiating pain to left shoulder, left arm, if he has above symptoms, I will order EMG nerve conduction studies.  IMPRESSION: Abnormal MRI scan of the cervical spine showing large left C6-7 disc osteophyte protrusion resulting in mild canal and severe left-sided foraminal narrowing.

## 2014-05-03 NOTE — Telephone Encounter (Signed)
Spoke to Nathan Durham - he is aware his appointment on 05/09/14 - arrive 12:45 for 1pm appt.

## 2014-05-09 ENCOUNTER — Ambulatory Visit (INDEPENDENT_AMBULATORY_CARE_PROVIDER_SITE_OTHER): Payer: Medicaid Other | Admitting: Neurology

## 2014-05-09 ENCOUNTER — Ambulatory Visit (INDEPENDENT_AMBULATORY_CARE_PROVIDER_SITE_OTHER): Payer: Self-pay | Admitting: Neurology

## 2014-05-09 DIAGNOSIS — R269 Unspecified abnormalities of gait and mobility: Secondary | ICD-10-CM

## 2014-05-09 DIAGNOSIS — M542 Cervicalgia: Secondary | ICD-10-CM

## 2014-05-09 DIAGNOSIS — Z0289 Encounter for other administrative examinations: Secondary | ICD-10-CM

## 2014-05-09 DIAGNOSIS — M791 Myalgia, unspecified site: Secondary | ICD-10-CM

## 2014-05-09 DIAGNOSIS — M5010 Cervical disc disorder with radiculopathy, unspecified cervical region: Secondary | ICD-10-CM | POA: Diagnosis not present

## 2014-05-09 DIAGNOSIS — R52 Pain, unspecified: Secondary | ICD-10-CM | POA: Insufficient documentation

## 2014-05-09 HISTORY — DX: Pain, unspecified: R52

## 2014-05-09 MED ORDER — GABAPENTIN 300 MG PO CAPS: 300.0000 mg | ORAL_CAPSULE | Freq: Three times a day (TID) | ORAL | Status: DC

## 2014-05-09 NOTE — Procedures (Signed)
   NCS (NERVE CONDUCTION STUDY) WITH EMG (ELECTROMYOGRAPHY) REPORT   STUDY DATE: February 23rd  2016 PATIENT NAME: Nathan Durham DOB: [birth year] MRN: [id number]    TECHNOLOGIST: Gearldine ShownLorraine Jones ELECTROMYOGRAPHER: Levert FeinsteinYan, Rogan Ecklund M.D.  CLINICAL INFORMATION:  54 years old gentleman, with subacute onset diffuse body achy Pain, frequent muscle spasm following his presumed tick bite April 2015, he complained of severe right lower extremity muscle spasm during today's testing,  FINDINGS: NERVE CONDUCTION STUDY: Bilateral ulnar sensory and motor responses were normal.  Bilateral median sensory response showed moderately prolonged peak latency, with preserved snap amplitude. Bilateral median motor responses showed mildly prolonged distal latency, with normal C map amplitude, conduction velocity.  NEEDLE ELECTROMYOGRAPHY: Selected needle examination was performed at left upper, bilateral lower extremity muscles, left cervical, bilateral lumbosacral paraspinal muscles.  Needle examination of left first also interossei, pronator teres, brachioradialis, biceps triceps deltoid was normal.  There was no spontaneous activity at left cervical paraspinal muscles, left C5-6 and 7.  Selected needle examination of bilateral tibialis anterior, tibialis posterior, vastus lateralis, biceps femoris long head was normal. There was no spontaneous activity at bilateral lumbosacral paraspinal muscles, bilateral L4-5 S1  IMPRESSION:   This is a mild abnormal study. There was no electrodiagnostic evidence of myopathy, left cervical radiculopathy, or bilateral lumbar radiculopathy.  There was evidence of moderate bilateral carpal tunnel syndromes.   INTERPRETING PHYSICIAN:   Levert FeinsteinYan, Edye Hainline M.D. Ph.D. [facility] [address], [address] [place], [place] [address] [phone]

## 2014-05-09 NOTE — Progress Notes (Signed)
PATIENT: Nathan Durham DOB: October 31, 1960  HISTORICAL  Nathan Durham is of 54 yo right-handed male, referred by his primary care PA Laurence Aly for evaluation of diffuse body aching pain  He was highly function prior to April 2015, does physical jobs, to have history of diabetes, hypertension  Early April 2015, he took out a tick  from his right ear, a week later, he noticed diffuse body ache you pain, muscle cramping, twitching, extreme fatigue, headaches, actually presented to [facility] emergency room, laboratory showed normal CBC, CPK 99, CMP, with exception of elevated glucose 200, normal UA  Ever since the symptom onset, he complains of extreme fatigue, tiredness, body achy pain, could no longer go back to his work, at its worst, he has difficulty swallowing, choking episodes, over the past few months, he was seen by different specialties, including his primary care physician at [facility], rheumatologist Dr. Rossie Muskrat at [facility].  He reported extensive laboratory evaluations, no etiology found, recently laboratory showed equivocal Lyme titer, he is referred to infectious disease specialist at the [facility] in Feb 8th 2016. He also reported normal EMG nerve conduction, laboratory evaluations, extensive cardiac evaluations,  Based on his symptoms, he was diagnosed with fibromyalgia, was started on prednisone around June 2015, which has been very helpful, he was able to swallowing better, after tapering off, he had recurrent swallowing difficulty, whole body achy pain, which again was under control after restarting prednisone 15 mg daily, recent 1 month, since December 2015, he also developed diffuse joint pain,  Laboratory evaluation from the [facility] rheumatology and arthritis, Lyme titers showed positive IgG P 45, P 41, P23, negative Lyme IgM, negative ANA with reflexes, uric acid 6.4,  Repeat laboratory at [facility] November 2015,  CPK 66, normal CMP, CBC.  EMG nerve conduction study in March 15 2014, at [facility], showed normal left upper, lower extremity sensory and motor conduction study, normal left upper extremity EMG  He also reported extensive laboratory evaluations, at his primary care [facility] [employer]   He denies visual change, mild gait difficulty, recent worsening constipation   UPDATE May 09 2014:   REVIEW OF SYSTEMS: Full 14 system review of systems performed and notable only for fatigue, chest pain, trouble swallowing, constipation, joint pain, cramps, aching muscles, headaches, weakness, difficulty swallowing,   ALLERGIES: Allergies  Allergen Reactions  . Cymbalta [Duloxetine Hcl] Nausea And Vomiting  . Celebrex [Celecoxib] Palpitations    Chest tight-     HOME MEDICATIONS: Current Outpatient Prescriptions  Medication Sig Dispense Refill  . allopurinol (ZYLOPRIM) 100 MG tablet Take 100 mg by mouth daily.    Marland Kitchen amLODipine (NORVASC) 5 MG tablet Take 5 mg by mouth daily.    . Cinnamon 500 MG capsule Take 500 mg by mouth daily.    Marland Kitchen GLIPIZIDE PO Take by mouth.    . hydrochlorothiazide (HYDRODIURIL) 25 MG tablet Take 25 mg by mouth daily.    Marland Kitchen HYDROcodone-acetaminophen (NORCO/VICODIN) 5-325 MG per tablet Take 1-2 tablets by mouth every 6 (six) hours as needed for moderate pain or severe pain. 15 tablet 0  . levothyroxine (SYNTHROID, LEVOTHROID) 75 MCG tablet Take 75 mcg by mouth daily before breakfast.    . lisinopril (PRINIVIL,ZESTRIL) 20 MG tablet Take 20 mg by mouth daily.    . Magnesium 400 MG TABS Take by mouth daily.    . metFORMIN (GLUCOPHAGE) 500 MG tablet Take 500 mg by mouth 2 (  two) times daily with a meal.    . Multiple Vitamin (MULTIVITAMIN) capsule Take 1 capsule by mouth daily.    . Multiple Vitamins-Minerals (MULTIVITAMIN PO) Take by mouth.    . omega-3 acid ethyl esters (LOVAZA) 1 G capsule Take by mouth daily.    . orphenadrine (NORFLEX) 100  MG tablet Take 100 mg by mouth 2 (two) times daily.    . Oxycodone-Acetaminophen (PERCOCET PO) Take by mouth.    . predniSONE (DELTASONE) 10 MG tablet Take 10 mg by mouth daily with breakfast.    . sitaGLIPtin-metformin (JANUMET) 50-1000 MG per tablet Take 1 tablet by mouth 2 (two) times daily with a meal.     No current facility-administered medications for this visit.    PAST MEDICAL HISTORY: Past Medical History  Diagnosis Date  . Asthma   . Hypertension   . Diabetes mellitus without complication   . Anxiety   . Kidney stones   . Crohn disease   . Thyroid disease   . Hyperlipidemia   . Myalgia   . Stenosis of carotid artery   . Cervical spondylosis   . Hypomagnesemia   . Depression     PAST SURGICAL HISTORY: Past Surgical History  Procedure Laterality Date  . Kidney stone surgery      FAMILY HISTORY: Family History  Problem Relation Age of Onset  . Arthritis Mother     Living    SOCIAL HISTORY:  History   Social History  . Marital Status: Legally Separated    Spouse Name: N/A    Number of Children: 1  . Years of Education: 12 years   Occupational History  . Disabled since April 2015,     Social History Main Topics  . Smoking status: Never Smoker   . Smokeless tobacco: Never Used  . Alcohol Use: 0.0 oz/week    0 Not specified per week     Comment: Occasionally  . Drug Use: No  . Sexual Activity: Not on file   Other Topics Concern  . Not on file   Social History Narrative   Live with parents.   1-2 cups/daily   Right-handed     PHYSICAL EXAM   There were no vitals filed for this visit. There is no weight on file to calculate BMI.   Generalized: In no acute distress  Neck: Supple, no carotid bruits   Cardiac: Regular rate rhythm  Pulmonary: Clear to auscultation bilaterally  Musculoskeletal: No deformity  Neurological examination  Mentation: Alert oriented to time, place, history taking, and causual conversation  Cranial nerve  II-XII: Pupils were equal round reactive to light. Extraocular movements were full.  Visual field were full on confrontational test. Bilateral fundi were sharp.  Facial sensation and strength were normal. Hearing was intact to finger rubbing bilaterally. Uvula tongue midline.  Head turning and shoulder shrug and were normal and symmetric.Tongue protrusion into cheek strength was normal.  Motor: Normal tone, bulk and strength.  Sensory: Intact to fine touch, pinprick, preserved vibratory sensation, and proprioception at toes.  Coordination: Normal finger to nose, heel-to-shin bilaterally there was no truncal ataxia  Gait: Rising up from seated position without assistance, normal stance, without trunk ataxia, moderate stride, good arm swing, smooth turning, able to perform tiptoe, and heel walking without difficulty.   Romberg signs: Negative  Deep tendon reflexes: Brachioradialis 2/2, biceps 2/2, triceps 2/2, patellar 3/3, Achilles 2/2, plantar responses were flexor bilaterally.   DIAGNOSTIC DATA (LABS, IMAGING, TESTING) - I reviewed patient records,  labs, notes, testing and imaging myself where available.  Lab Results  Component Value Date   WBC 10.9* 01/17/2014   HGB 15.0 01/17/2014   HCT 41.7 01/17/2014   MCV 87.4 01/17/2014   PLT 293 01/17/2014      Component Value Date/Time   NA 137 01/17/2014 2359   K 3.9 01/17/2014 2359   CL 96 01/17/2014 2359   CO2 24 01/17/2014 2359   GLUCOSE 166* 01/17/2014 2359   BUN 21 01/17/2014 2359   CREATININE 0.90 01/17/2014 2359   CALCIUM 10.4 01/17/2014 2359   PROT 7.1 01/17/2014 2359   ALBUMIN 3.6 01/17/2014 2359   AST 21 01/17/2014 2359   ALT 30 01/17/2014 2359   ALKPHOS 59 01/17/2014 2359   BILITOT 0.4 01/17/2014 2359   GFRNONAA >90 01/17/2014 2359   GFRAA >90 01/17/2014 2359   ASSESSMENT AND PLAN  Nathan Durham is a 54 y.o. male  with rapid onset of whole body pain, fatigue, since April 2015, following tick bite, with positive  Lyme titer IgG, P 45, 41, 23,, good response to prednisone, on examination, he has hyperreflexia, complains mild gait difficulty, constipation,   1, MRI of cervical spine to rule out cervical cord lesion  2.  Follow-up with his infectious disease appointment February eighth 2016  3. return to clinic in 3-4 weeks   No orders of the defined types were placed in this encounter.    No Follow-up on file.    Levert Feinstein, M.D. Ph.D.  [facility] [address] [place], [place] [address] [phone]

## 2014-05-09 NOTE — Progress Notes (Signed)
PATIENT: Nathan Durham Face DOB: 1960-06-22  HISTORICAL  Nathan Durham is of 54 yo right-handed male, referred by his primary care PA Laurence Alyyan Miller for evaluation of diffuse body aching pain  He was highly function prior to April 2015, does physical jobs, have history of diabetes, hypertension  Early April 2015, he took out a tick  from his right ear, a week later, he noticed diffuse body ache you pain, muscle cramping, twitching, extreme fatigue, headaches, actually presented [facility], laboratory showed normal CBC, CPK 99, CMP, with exception of elevated glucose 200, normal UA  Ever since the symptom onset, he complains of extreme fatigue, tiredness, body achy pain, could no longer go back to his work, at its worst, he has difficulty swallowing, choking episodes, over the past few months, he was seen by different specialties, including [facility], rheumatologist Dr. Rossie Muskratauseef G Syed at [facility].  He reported extensive laboratory evaluations, no etiology found, recently laboratory showed equivocal Lyme titer, he is referred to infectious disease specialist at the [facility] in Feb 8th 2016. He also reported normal EMG nerve conduction, laboratory evaluations, extensive cardiac evaluations,  Based on his symptoms, he was diagnosed with fibromyalgia, was started on prednisone around June 2015, which has been very helpful, he was able to swallowing better, after tapering off, he had recurrent swallowing difficulty, whole body achy pain, which again was under control after restarting prednisone 15 mg daily, recent 1 month, since December 2015, he also developed diffuse joint pain,  Laboratory evaluation from the [facility] rheumatology and arthritis, Lyme titers showed positive IgG P 45, P 41, P23, negative Lyme IgM, negative ANA with reflexes, uric acid 6.4,  Repeat laboratory at [facility] November 2015, CPK  66, normal CMP, CBC.  EMG nerve conduction study in March 15 2014, at [facility], showed normal left upper, lower extremity sensory and motor conduction study, normal left upper extremity EMG  He also reported extensive laboratory evaluations, at his primary care [facility], [facility]   He denies visual change, mild gait difficulty, recent worsening constipation   Update February 23rd 2016:  He continues to complains diffuse body achy pain, severe muscles spasm, pain, involving different body parts, he came in for electrodiagnostic study today, which showed moderate bilateral carpal tunnel syndromes, there was no evidence of lumbar radiculopathy, left cervical radiculopathy or inflammatory myopathy.  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  He will have a follow-up appointment with [facility] infectious disease specialist for his positive Lyme IgG antibody.  REVIEW OF SYSTEMS: Full 14 system review of systems performed and notable only for fatigue, chest pain, trouble swallowing, constipation, joint pain, cramps, aching muscles, headaches, weakness, difficulty swallowing,   ALLERGIES: Allergies  Allergen Reactions  . Cymbalta [Duloxetine Hcl] Nausea And Vomiting  . Celebrex [Celecoxib] Palpitations    Chest tight-     HOME MEDICATIONS: Current Outpatient Prescriptions  Medication Sig Dispense Refill  . allopurinol (ZYLOPRIM) 100 MG tablet Take 100 mg by mouth daily.    Marland Kitchen. amLODipine (NORVASC) 5 MG tablet Take 5 mg by mouth daily.    . Cinnamon 500 MG capsule Take 500 mg by mouth daily.    Marland Kitchen. gabapentin (NEURONTIN) 300 MG capsule Take 1 capsule (300 mg total) by mouth 3 (three) times daily. 90 capsule 6  . GLIPIZIDE PO Take  by mouth.    . hydrochlorothiazide (HYDRODIURIL) 25 MG tablet Take 25 mg by mouth daily.    Marland Kitchen  HYDROcodone-acetaminophen (NORCO/VICODIN) 5-325 MG per tablet Take 1-2 tablets by mouth every 6 (six) hours as needed for moderate pain or severe pain. 15 tablet 0  . levothyroxine (SYNTHROID, LEVOTHROID) 75 MCG tablet Take 75 mcg by mouth daily before breakfast.    . lisinopril (PRINIVIL,ZESTRIL) 20 MG tablet Take 20 mg by mouth daily.    . Magnesium 400 MG TABS Take by mouth daily.    . metFORMIN (GLUCOPHAGE) 500 MG tablet Take 500 mg by mouth 2 (two) times daily with a meal.    . Multiple Vitamin (MULTIVITAMIN) capsule Take 1 capsule by mouth daily.    . Multiple Vitamins-Minerals (MULTIVITAMIN PO) Take by mouth.    . omega-3 acid ethyl esters (LOVAZA) 1 G capsule Take by mouth daily.    . orphenadrine (NORFLEX) 100 MG tablet Take 100 mg by mouth 2 (two) times daily.    . Oxycodone-Acetaminophen (PERCOCET PO) Take by mouth.    . predniSONE (DELTASONE) 10 MG tablet Take 10 mg by mouth daily with breakfast.    . sitaGLIPtin-metformin (JANUMET) 50-1000 MG per tablet Take 1 tablet by mouth 2 (two) times daily with a meal.     No current facility-administered medications for this visit.    PAST MEDICAL HISTORY: Past Medical History  Diagnosis Date  . Asthma   . Hypertension   . Diabetes mellitus without complication   . Anxiety   . Kidney stones   . Crohn disease   . Thyroid disease   . Hyperlipidemia   . Myalgia   . Stenosis of carotid artery   . Cervical spondylosis   . Hypomagnesemia   . Depression     PAST SURGICAL HISTORY: Past Surgical History  Procedure Laterality Date  . Kidney stone surgery      FAMILY HISTORY: Family History  Problem Relation Age of Onset  . Arthritis Mother     Living    SOCIAL HISTORY:  History   Social History  . Marital Status: Legally Separated    Spouse Name: N/A    Number of Children: 1  . Years of Education: 12 years   Occupational History  . Disabled since April 2015,     Social History Main Topics  . Smoking status: Never  Smoker   . Smokeless tobacco: Never Used  . Alcohol Use: 0.0 oz/week    0 Not specified per week     Comment: Occasionally  . Drug Use: No  . Sexual Activity: Not on file   Other Topics Concern  . Not on file   Social History Narrative   Live with parents.   1-2 cups/daily   Right-handed     PHYSICAL EXAM   There were no vitals filed for this visit. There is no weight on file to calculate BMI.   Generalized: In no acute distress  Neck: Supple, no carotid bruits   Cardiac: Regular rate rhythm  Pulmonary: Clear to auscultation bilaterally  Musculoskeletal: No deformity  Neurological examination  Mentation: Alert oriented to time, place, history taking, and causual conversation  Cranial nerve II-XII: Pupils were equal round reactive to light. Extraocular movements were full.  Visual field were full on confrontational test. Bilateral fundi were sharp.  Facial sensation and strength were normal. Hearing was intact to finger rubbing bilaterally. Uvula tongue midline.  Head turning and shoulder shrug and were normal and symmetric.Tongue protrusion  into cheek strength was normal.  Motor: Normal tone, bulk and strength.  Sensory: Intact to fine touch, pinprick, preserved vibratory sensation, and proprioception at toes.  Coordination: Normal finger to nose, heel-to-shin bilaterally there was no truncal ataxia  Gait: Rising up from seated position without assistance, normal stance, without trunk ataxia, moderate stride, good arm swing, smooth turning, able to perform tiptoe, and heel walking without difficulty.   Romberg signs: Negative  Deep tendon reflexes: Brachioradialis 2/2, biceps 2/2, triceps 2/2, patellar 3/3, Achilles 2/2, plantar responses were flexor bilaterally.   DIAGNOSTIC DATA (LABS, IMAGING, TESTING) - I reviewed patient records, labs, notes, testing and imaging myself where available.  Lab Results  Component Value Date   WBC 10.9* 01/17/2014   HGB 15.0  01/17/2014   HCT 41.7 01/17/2014   MCV 87.4 01/17/2014   PLT 293 01/17/2014      Component Value Date/Time   NA 137 01/17/2014 2359   K 3.9 01/17/2014 2359   CL 96 01/17/2014 2359   CO2 24 01/17/2014 2359   GLUCOSE 166* 01/17/2014 2359   BUN 21 01/17/2014 2359   CREATININE 0.90 01/17/2014 2359   CALCIUM 10.4 01/17/2014 2359   PROT 7.1 01/17/2014 2359   ALBUMIN 3.6 01/17/2014 2359   AST 21 01/17/2014 2359   ALT 30 01/17/2014 2359   ALKPHOS 59 01/17/2014 2359   BILITOT 0.4 01/17/2014 2359   GFRNONAA >90 01/17/2014 2359   GFRAA >90 01/17/2014 2359   ASSESSMENT AND PLAN  Jomar Denz is a 54 y.o. male  with rapid onset of whole body pain, fatigue, since April 2015, following tick bite, with positive Lyme titer IgG, P 45, 41, 23,, good response to prednisone, on examination, he has hyperreflexia, complains mild gait difficulty, constipation, today's electrodiagnostic study showed no evidence of myopathy, no evidence of lumbar radiculopathy, left cervical radiculopathy.  We have reviewed MRI cervical spine, showing large left C6-7 disc osteophyte protrusion resulting in mild canal and severe left-sided foraminal narrowing.  He is to continue follow-up with primary care physician, infectious disease for positively IgG Lyme titer, his complains of diffuse body achy pain, is most consistent with musculoskeletal etiology, return to clinic for new issues,  Levert Feinstein, M.D. Ph.D.  [facility] [address] [place], [place] [address] [phone]

## 2014-05-18 ENCOUNTER — Ambulatory Visit: Payer: Medicaid Other | Admitting: Neurology

## 2014-09-06 ENCOUNTER — Emergency Department (HOSPITAL_BASED_OUTPATIENT_CLINIC_OR_DEPARTMENT_OTHER)
Admission: EM | Admit: 2014-09-06 | Discharge: 2014-09-06 | Disposition: A | Discharge status: Home / Self Care | Payer: Medicaid Other | Attending: Emergency Medicine | Admitting: Emergency Medicine

## 2014-09-06 ENCOUNTER — Encounter (HOSPITAL_BASED_OUTPATIENT_CLINIC_OR_DEPARTMENT_OTHER): Payer: Self-pay

## 2014-09-06 DIAGNOSIS — Z87442 Personal history of urinary calculi: Secondary | ICD-10-CM | POA: Diagnosis not present

## 2014-09-06 DIAGNOSIS — S61211A Laceration without foreign body of left index finger without damage to nail, initial encounter: Secondary | ICD-10-CM | POA: Diagnosis present

## 2014-09-06 DIAGNOSIS — G8929 Other chronic pain: Secondary | ICD-10-CM | POA: Insufficient documentation

## 2014-09-06 DIAGNOSIS — Y93E5 Activity, floor mopping and cleaning: Secondary | ICD-10-CM | POA: Diagnosis not present

## 2014-09-06 DIAGNOSIS — Z8659 Personal history of other mental and behavioral disorders: Secondary | ICD-10-CM | POA: Diagnosis not present

## 2014-09-06 DIAGNOSIS — Y998 Other external cause status: Secondary | ICD-10-CM | POA: Insufficient documentation

## 2014-09-06 DIAGNOSIS — Z7952 Long term (current) use of systemic steroids: Secondary | ICD-10-CM | POA: Diagnosis not present

## 2014-09-06 DIAGNOSIS — F419 Anxiety disorder, unspecified: Secondary | ICD-10-CM | POA: Diagnosis not present

## 2014-09-06 DIAGNOSIS — Y9289 Other specified places as the place of occurrence of the external cause: Secondary | ICD-10-CM | POA: Insufficient documentation

## 2014-09-06 DIAGNOSIS — Z23 Encounter for immunization: Secondary | ICD-10-CM | POA: Diagnosis not present

## 2014-09-06 DIAGNOSIS — E079 Disorder of thyroid, unspecified: Secondary | ICD-10-CM | POA: Insufficient documentation

## 2014-09-06 DIAGNOSIS — J45909 Unspecified asthma, uncomplicated: Secondary | ICD-10-CM | POA: Diagnosis not present

## 2014-09-06 DIAGNOSIS — I10 Essential (primary) hypertension: Secondary | ICD-10-CM | POA: Diagnosis not present

## 2014-09-06 DIAGNOSIS — Y288XXA Contact with other sharp object, undetermined intent, initial encounter: Secondary | ICD-10-CM | POA: Diagnosis not present

## 2014-09-06 DIAGNOSIS — Z8719 Personal history of other diseases of the digestive system: Secondary | ICD-10-CM | POA: Diagnosis not present

## 2014-09-06 DIAGNOSIS — E119 Type 2 diabetes mellitus without complications: Secondary | ICD-10-CM | POA: Insufficient documentation

## 2014-09-06 DIAGNOSIS — Z79899 Other long term (current) drug therapy: Secondary | ICD-10-CM | POA: Diagnosis not present

## 2014-09-06 DIAGNOSIS — IMO0002 Reserved for concepts with insufficient information to code with codable children: Secondary | ICD-10-CM

## 2014-09-06 DIAGNOSIS — Z8739 Personal history of other diseases of the musculoskeletal system and connective tissue: Secondary | ICD-10-CM | POA: Diagnosis not present

## 2014-09-06 HISTORY — DX: Other chronic pain: G89.29

## 2014-09-06 MED ORDER — TETANUS-DIPHTH-ACELL PERTUSSIS 5-2.5-18.5 LF-MCG/0.5 IM SUSP
0.5000 mL | Freq: Once | INTRAMUSCULAR | Status: AC
  Administered 2014-09-06: 0.5 mL via INTRAMUSCULAR
  Filled 2014-09-06: qty 0.5

## 2014-09-06 MED ORDER — LIDOCAINE HCL (PF) 1 % IJ SOLN
INTRAMUSCULAR | Status: AC
  Administered 2014-09-06: 23:00:00
  Filled 2014-09-06: qty 5

## 2014-09-06 NOTE — ED Notes (Addendum)
Finger lac to left index finger-cut on razor blade while cleaning around sink-? Reason razor blade lodged in area around sink- approx 45 min PTA-lac noted to tip of index finger-no bleeding-gauze taped in place

## 2014-09-06 NOTE — Discharge Instructions (Signed)

## 2014-09-06 NOTE — ED Provider Notes (Signed)
CSN: [id number]     Arrival date & time 09/06/14  2035 History  This chart was scribed for Rolan Bucco, MD by Phillis Haggis, ED Scribe. This patient was seen in room MH07/MH07 and patient care was started at 10:18 PM.   Chief Complaint  Patient presents with  . Laceration   The history is provided by the patient. No language interpreter was used.    HPI Comments: Nathan Durham is a 54 y.o. male who presents to the Emergency Department complaining of a laceration onset 3 hours ago. He states that he was cleaning a sink that had a razor blade stuck in it and cut his left index finger trying to get the blade out.   He states that it has been over 15 years since his last tdap. He denies any other symptoms or injury.  Past Medical History  Diagnosis Date  . Asthma   . Hypertension   . Diabetes mellitus without complication   . Anxiety   . Kidney stones   . Crohn disease   . Thyroid disease   . Hyperlipidemia   . Myalgia   . Stenosis of carotid artery   . Cervical spondylosis   . Hypomagnesemia   . Depression   . Chronic pain    Past Surgical History  Procedure Laterality Date  . Kidney stone surgery     Family History  Problem Relation Age of Onset  . Arthritis Mother     Living   History  Substance Use Topics  . Smoking status: Never Smoker   . Smokeless tobacco: Never Used  . Alcohol Use: No    Review of Systems  Constitutional: Negative for fever.  Gastrointestinal: Negative for nausea and vomiting.  Musculoskeletal: Negative for back pain, joint swelling, arthralgias and neck pain.  Skin: Positive for wound.  Neurological: Negative for weakness, numbness and headaches.      Allergies  Cymbalta and Celebrex  Home Medications   Prior to Admission medications   Medication Sig Start Date End Date Taking? Authorizing Provider  allopurinol (ZYLOPRIM) 100 MG tablet Take 100 mg by mouth daily.    Historical Provider, MD  Cinnamon 500 MG capsule Take 500 mg  by mouth daily.    Historical Provider, MD  gabapentin (NEURONTIN) 300 MG capsule Take 1 capsule (300 mg total) by mouth 3 (three) times daily. 05/09/14   Levert Feinstein, MD  GLIPIZIDE PO Take by mouth.    Historical Provider, MD  hydrochlorothiazide (HYDRODIURIL) 25 MG tablet Take 25 mg by mouth daily.    Historical Provider, MD  HYDROcodone-acetaminophen (NORCO/VICODIN) 5-325 MG per tablet Take 1-2 tablets by mouth every 6 (six) hours as needed for moderate pain or severe pain. 01/18/14   Gwyneth Sprout, MD  levothyroxine (SYNTHROID, LEVOTHROID) 75 MCG tablet Take 75 mcg by mouth daily before breakfast.    Historical Provider, MD  Magnesium 400 MG TABS Take by mouth daily.    Historical Provider, MD  metFORMIN (GLUCOPHAGE) 500 MG tablet Take 500 mg by mouth 2 (two) times daily with a meal.    Historical Provider, MD  Multiple Vitamin (MULTIVITAMIN) capsule Take 1 capsule by mouth daily.    Historical Provider, MD  Multiple Vitamins-Minerals (MULTIVITAMIN PO) Take by mouth.    Historical Provider, MD  omega-3 acid ethyl esters (LOVAZA) 1 G capsule Take by mouth daily.    Historical Provider, MD  orphenadrine (NORFLEX) 100 MG tablet Take 100 mg by mouth 2 (two) times daily.    Historical  Provider, MD  Oxycodone-Acetaminophen (PERCOCET PO) Take by mouth.    Historical Provider, MD  predniSONE (DELTASONE) 10 MG tablet Take 10 mg by mouth daily with breakfast.    Historical Provider, MD  sitaGLIPtin-metformin (JANUMET) 50-1000 MG per tablet Take 1 tablet by mouth 2 (two) times daily with a meal.    Historical Provider, MD   BP 150/61 mmHg  Pulse 79  Temp(Src) 98.5 F (36.9 C) (Oral)  Resp 18  Ht  (1.778 m)  Wt 223 lb (101.152 kg)  BMI 32.00 kg/m2  SpO2 99% Physical Exam  Constitutional: He is oriented to person, place, and time. He appears well-developed and well-nourished.  HENT:  Head: Normocephalic and atraumatic.  Neck: Normal range of motion. Neck supple.  Cardiovascular: Normal  rate.   Pulmonary/Chest: Effort normal.  Musculoskeletal: He exhibits no edema or tenderness.  1cm laceration to the volar surface of the left index finger overlying the distal phalanx.  He is able to flex and extend the finger including isolated flexion of the distal phalanx. He has normal sensation distally. There is no active bleeding.  Neurological: He is alert and oriented to person, place, and time.  Skin: Skin is warm and dry.  Psychiatric: He has a normal mood and affect.    ED Course  Procedures (including critical care time) DIAGNOSTIC STUDIES: Oxygen Saturation is 99% on RA, normal by my interpretation.    COORDINATION OF CARE: 10:20 PM-Discussed treatment plan which includes laceration repair and updated tdap with pt at bedside and pt agreed to plan.   LACERATION REPAIR Performed by: Rolan Bucco, MD Consent: Verbal consent obtained. Risks and benefits: risks, benefits and alternatives were discussed Patient identity confirmed: provided demographic data Time out performed prior to procedure Prepped and Draped in normal sterile fashion Wound explored Laceration Location: left index finger Laceration Length: 1cm No Foreign Bodies seen or palpated Anesthesia: local infiltration Local anesthetic: lidocaine 1% plain epinephrine Anesthetic total: 1 ml Irrigation method: syringe Amount of cleaning: standard Skin closure: close Number of sutures or staples: 3 Technique: simple interupted Patient tolerance: Patient tolerated the procedure well with no immediate complications.   Labs Review Labs Reviewed - No data to display  Imaging Review No results found.   EKG Interpretation None      MDM   Final diagnoses:  Laceration    Patient's laceration was appeared in the ED. His tetanus shot was updated. He was discharged with wound care instructions and advised to follow-up with his primary care physician in one week for suture removal. He was advised to return  here if he has any signs of infection.  I personally performed the services described in this documentation, which was scribed in my presence.  The recorded information has been reviewed and considered.    Rolan Bucco, MD 09/06/14 2303

## 2014-09-06 NOTE — ED Notes (Signed)
Adult male with pt ? Wait time-advised unsure due to ED full at this time-states she and pt went to [facility] ED and there was a 2+ hour wait so they came here

## 2016-05-17 IMAGING — CR DG ORBITS FOR FOREIGN BODY
2 series · 2 of 2 positions shown · non-contrast
Comparison: None.

CLINICAL DATA: Metal working/exposure; clearance prior to MRI

EXAM:
ORBITS FOR FOREIGN BODY - 2 VIEW

[view not recorded (1 of 2)]
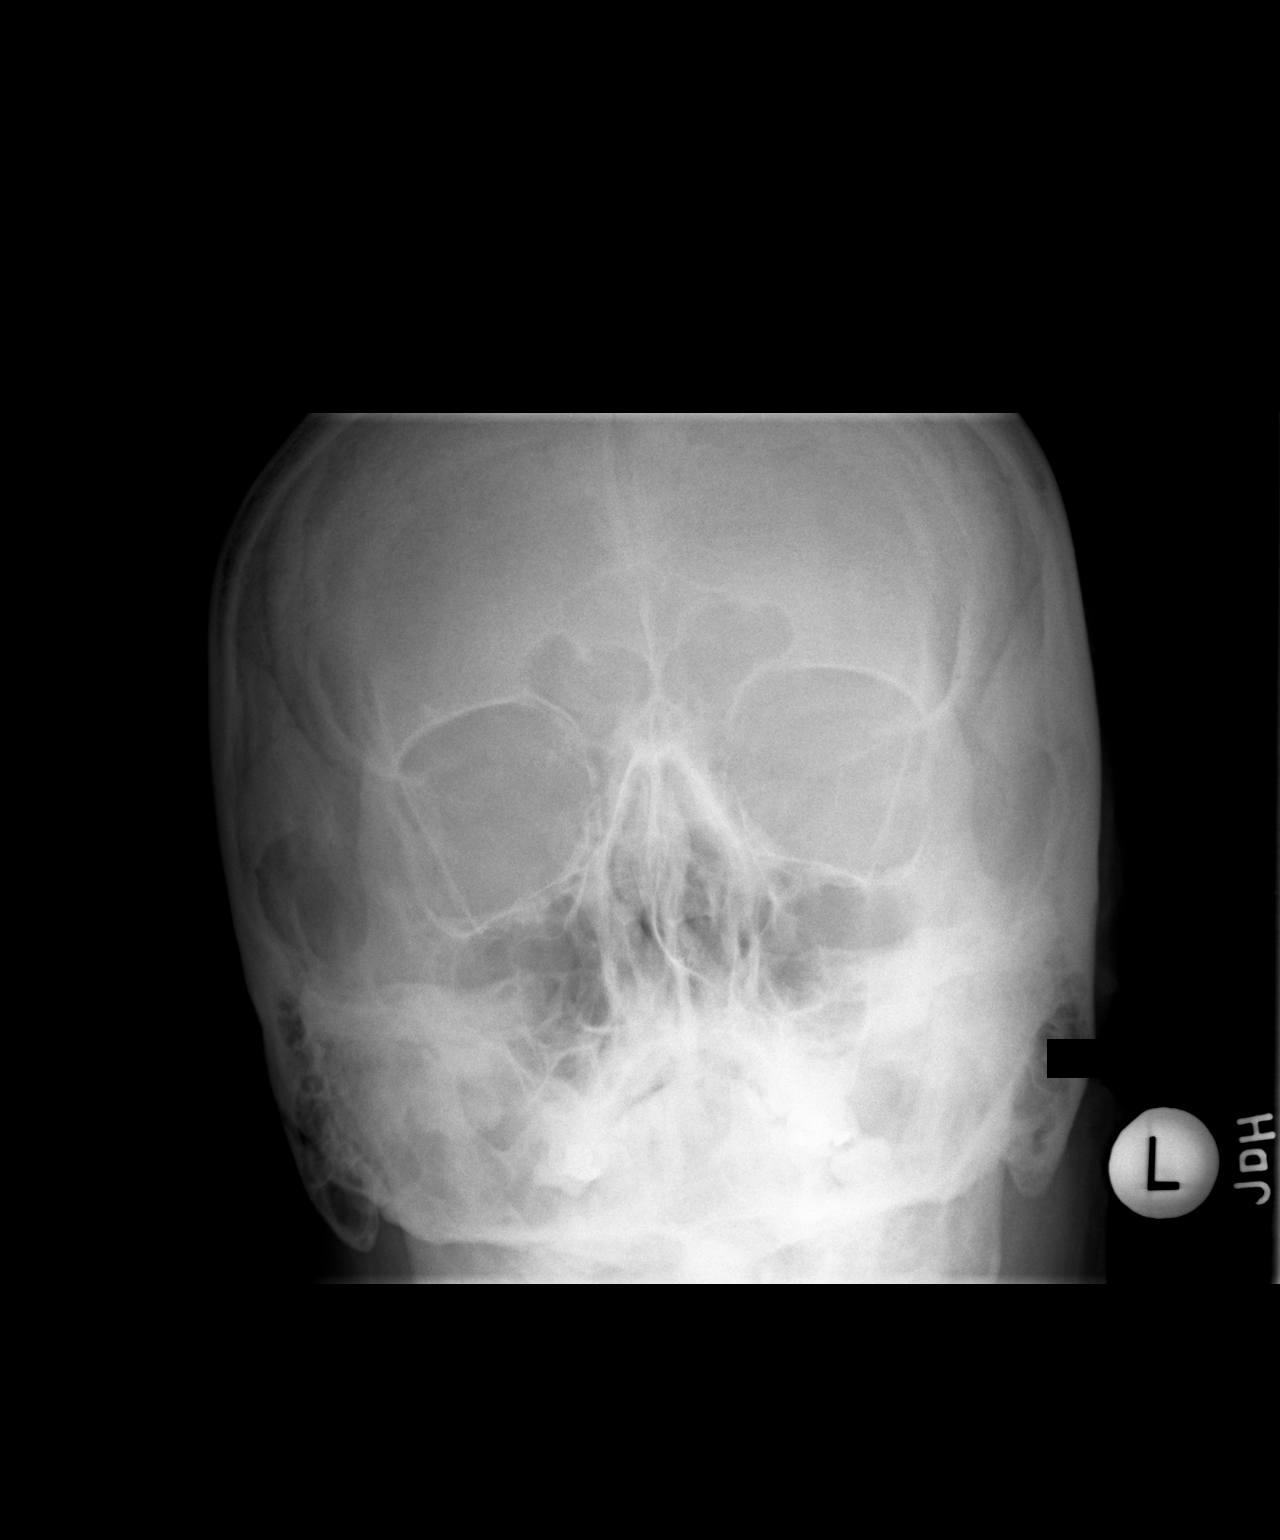

[view not recorded (2 of 2)]
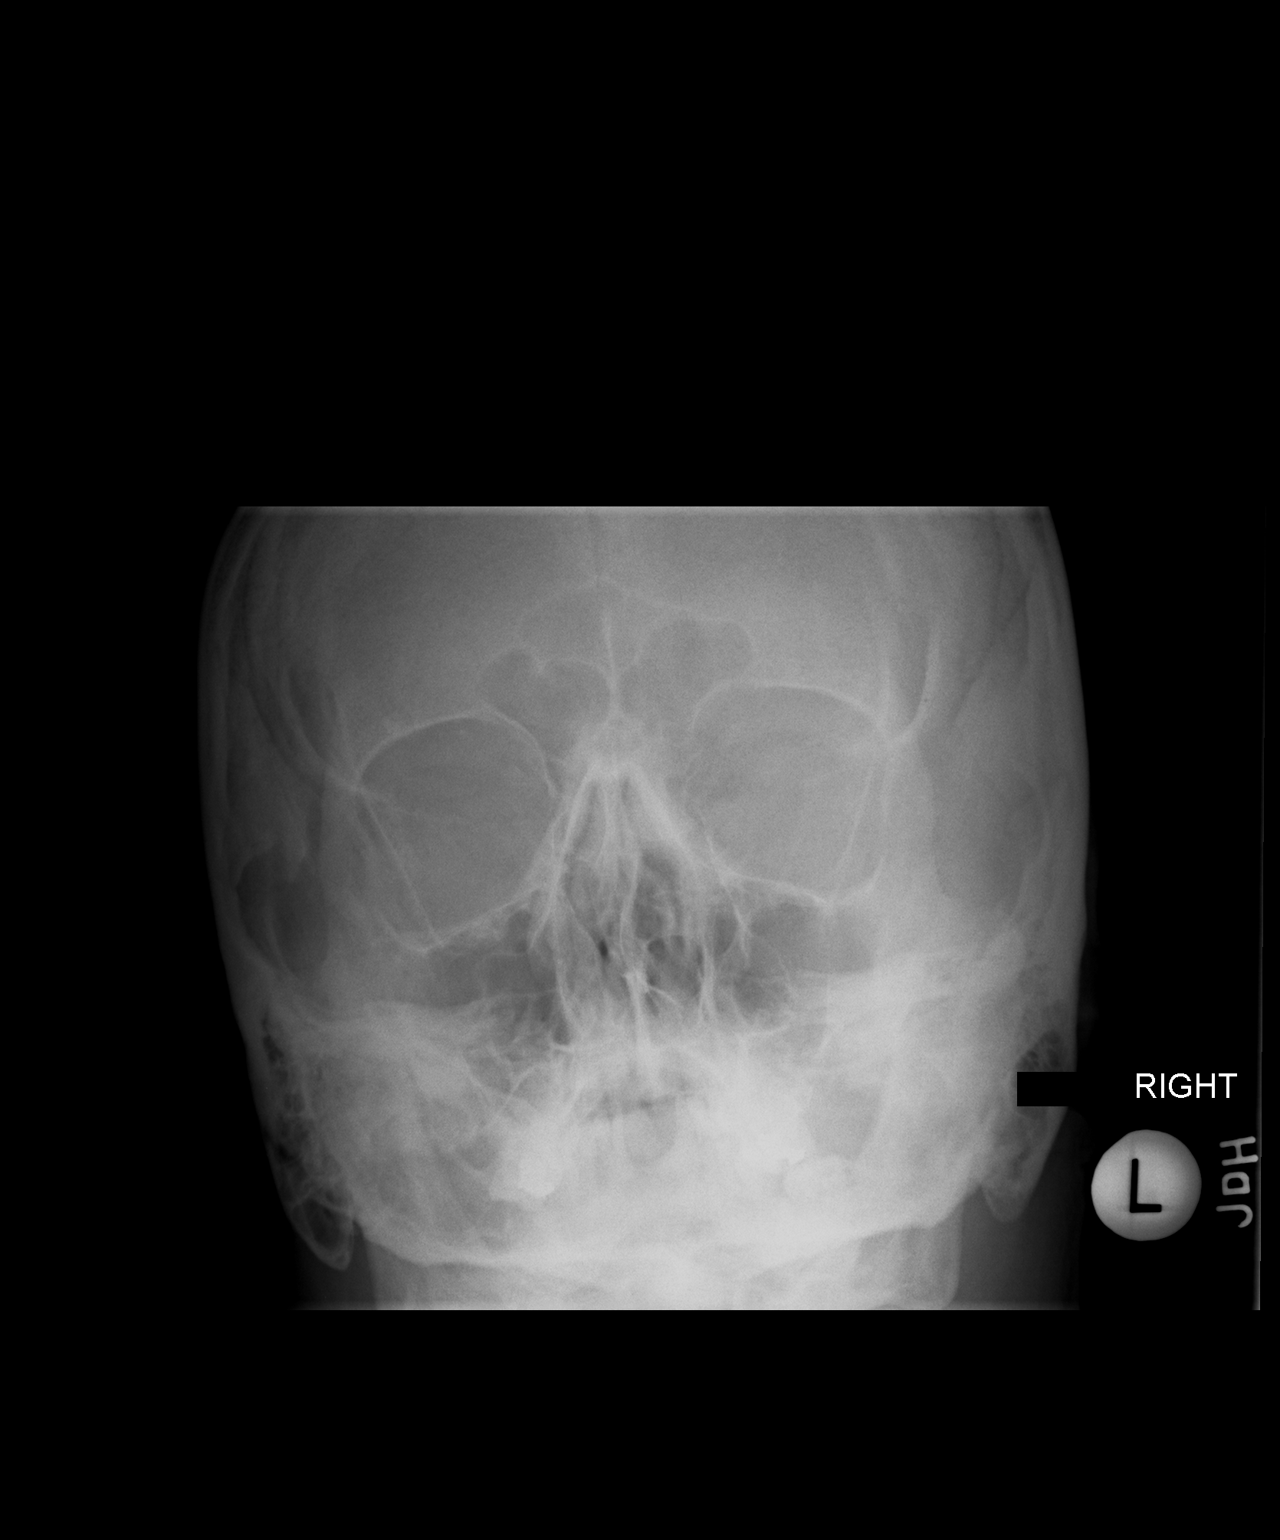

[2 of 2 positions shown; findings below may reference images not displayed]

FINDINGS: There is no evidence of metallic foreign body within the orbits. No
significant bone abnormality identified.
IMPRESSION: No evidence of metallic foreign body within the orbits.

## 2020-12-05 NOTE — ED Provider Notes (Signed)
 Chief Complaint  Patient presents with  . Chest Pain    chest pain started this morning and is causing nasuea    Nathan Albert Cada Jr. is a 60 y.o. male with multiple comorbidities including but not limited to CAD status post CABG, paroxysmal atrial fibrillation, polymyalgia rheumatica, chronic back pain, Crohn's disease who presents with a chief complaint of right-sided chest pain that woke the patient up 7 hours prior to arrival.  Patient stated is sharp and constant at this time.  Patient states he has not had a recent stress test or heart catheterization.  He is status post bypass and has been taking his medications as prescribed.  He has nausea with this episode, denies any fevers, chills, vomiting, shortness of breath, or abdominal pain.  He did not take any aspirin or nitroglycerin before arrival.    Review of Systems  Constitutional: Negative for chills and fever.  HENT: Negative for ear pain and sore throat.   Eyes: Negative for pain and visual disturbance.  Respiratory: Negative for cough and shortness of breath.   Cardiovascular: Positive for chest pain. Negative for palpitations.  Gastrointestinal: Positive for nausea. Negative for abdominal pain and vomiting.  Genitourinary: Negative for dysuria and hematuria.  Musculoskeletal: Negative for arthralgias and back pain.  Skin: Negative for color change and rash.  Neurological: Negative for seizures and syncope.  All other systems reviewed and are negative.  Past Medical History:  Diagnosis Date  . Asthma   . CAD (coronary artery disease)   . Carpal tunnel syndrome, bilateral   . Chronic back pain   . Chronic bilateral low back pain without sciatica   . Chronic pain syndrome   . Crohn's disease (*)   . DDD (degenerative disc disease), cervical   . Diabetes mellitus (*)    IDDM  . Disease of thyroid gland   . Dupuytren's contracture of right hand   . Fibromyalgia   . GERD (gastroesophageal reflux disease)   .  Gout   . Hypertension   . IBD (inflammatory bowel disease)   . IDA (iron deficiency anemia)   . Kidney stone   . Neuropathy   . Paroxysmal atrial fibrillation (*)   . Polymyalgia rheumatica (*)   . Polymyositis (*) 02/03/2014  . 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 Vitamin D deficiency    Past Surgical History:  Procedure Laterality Date  . Cardiac catheterization  08/14/2015   3 vessel CAD  . Colonoscopy  2015   [facility]   . Coronary artery bypass graft  09/04/2015   CORONARY ARTERY BYPASS GRAFT X3 WITH MAZE, PULMONARY VEIN ISOLATION AND LEFT ATRIAL APPENDAGE LIGATION - Dr. Joline  . Cystoscopy w/ ureteroscopy w/ lithotripsy     x2  . Endoscopy    . Endoscopy wrist surg; w/releas transver ligamt Right 10/21/2016  . Fasciect part palm w/rel 1 digit; ea add digit Right 10/21/2016  . Fasciect part palmar w/rel 1 digt w/wo z-plasty Right 10/21/2016  . Open treatment of femoral fracture, proximal end, neck, internal fixation or prosthetic replacement Left 07/24/2016  . Partial hip arthroplasty    . Right endoscopic carpal tunnel release, ulnar nerve transposition, dupyten's release palmer, ring, long and fifth finger Right 10/21/2016  . Upper gastrointestinal endoscopy  2015   [facility]   Family History  Problem Relation Age of Onset  . Diabetes Mother   . Diabetes Father   . Heart disease Father   . Kidney disease Father  Social History   Tobacco Use  . Smoking status: Never Smoker  . Smokeless tobacco: Never Used  Substance Use Topics  . Alcohol use: Not Currently   Discharge Medication List as of 12/05/2020 11:52 AM    START taking these medications   Details  naproxen (NAPROSYN) 500 mg tablet Take one tablet (500 mg dose) by mouth 2 (two) times daily for 10 days., Starting Wed 12/05/2020, Until Sat 12/15/2020, Print      CONTINUE these medications which have NOT CHANGED   Details  ACCU-CHEK AVIVA PLUS test strip Checking 4 times per day as instructed,  Normal    amLODIPine besylate (NORVASC) 5 mg tablet TAKE 1 TABLET DAILY, Normal    Cinnamon 500 MG TABS Take 500 mg by mouth 2 (two) times daily., Historical Med    ferrous sulfate 325 (65 FE) MG EC tablet take 1 tablet by mouth twice a day, Normal    hydrochlorothiazide (MICROZIDE) 12.5 MG capsule Take one capsule (12.5 mg dose) by mouth daily., Starting Mon 09/12/2019, Normal    Insulin NPH Human, Isophane, (NOVOLIN N [place]) Inject into the skin. 30 units in the am and 15 in the afternoon, Historical Med    Insulin Syringe-Needle U-100 (TRUEPLUS INSULIN SYRINGE) 31G X 5/16 0.3 ML MISC 1 injection by Does not apply route 2 (two) times daily. Use one syringe to inject insulin 2 times daily, Starting Fri 07/15/2019, Normal    levothyroxine sodium (SYNTHROID,LEVOTHROID,LEVOXYL) 100 mcg tablet TAKE 1 TABLET EVERY DAY, Normal    metformin (GLUCOPHAGE) 1000 MG tablet Take 1 tablet twice daily with meals, Normal    metoprolol tartrate (LOPRESSOR) 100 mg tablet TAKE 1 TABLET TWICE A DAY, Normal    Multiple Vitamin (MULTIVITAMIN) capsule Take 1 capsule by mouth daily., Until Discontinued, Historical Med    pravastatin sodium (PRAVACHOL) 40 mg tablet TAKE 1 TABLET EVERY EVENING, Normal    rivaroxaban (XARELTO) 20 mg TABS Take one tablet (20 mg dose) by mouth daily., Starting Thu 10/25/2020, Until Fri 10/25/2021, Normal    Vitamin D, Cholecalciferol, 50 MCG (2000 UT) CAPS Take by mouth daily., Historical Med       Allergies  Allergen Reactions  . Celebrex [Celecoxib] Palpitations and Other    palpitations Chest tight-   . Dilaudid  [Hydromorphone  Hcl] Hypotension  . Cymbalta [Duloxetine Hcl] Nausea And Vomiting  . Pregabalin Other    Uncontrolled blood sugar     PHYSICAL EXAM: ED Triage Vitals  Enc Vitals Group     BP 12/05/20 0651 (!) 178/82     Heart Rate 12/05/20 0651 63     Resp 12/05/20 0651 21     Temp 12/05/20 0649 98 F (36.7 C)     Temp src 12/05/20 0649 Oral     SpO2  12/05/20 0651 100 %     Weight 12/05/20 0649 104.3 kg (230 lb)     Height 12/05/20 0649 1.803 m (5' 11)     Head Circumference --      Peak Flow --      Pain Score 12/05/20 0649 Nine     Pain Loc --      Pain Edu? --      Excl. in GC? --    Physical Exam Vitals and nursing note reviewed.  Constitutional:      General: He is not in acute distress.    Appearance: He is well-developed. He is obese. He is not ill-appearing or toxic-appearing.  HENT:     Head: Normocephalic  and atraumatic.     Nose: Nose normal.     Mouth/Throat:     Mouth: Mucous membranes are moist.     Pharynx: Oropharynx is clear.  Eyes:     Extraocular Movements: Extraocular movements intact.     Conjunctiva/sclera: Conjunctivae normal.     Pupils: Pupils are equal, round, and reactive to light.  Cardiovascular:     Rate and Rhythm: Normal rate and regular rhythm.     Pulses: Normal pulses.     Heart sounds: Normal heart sounds. No murmur heard. Pulmonary:     Effort: Pulmonary effort is normal. No respiratory distress.     Breath sounds: Normal breath sounds. No wheezing or rales.  Abdominal:     General: Bowel sounds are normal. There is no distension.     Palpations: Abdomen is soft.     Tenderness: There is no abdominal tenderness.  Musculoskeletal:        General: Normal range of motion.     Cervical back: Neck supple.  Skin:    General: Skin is warm and dry.     Capillary Refill: Capillary refill takes less than 2 seconds.  Neurological:     General: No focal deficit present.     Mental Status: He is alert and oriented to person, place, and time. Mental status is at baseline.     DIAGNOSTICS:  Pulse Ox: Pulse oximetry 100% on RA indicating adequate oxygenation.  Imaging: XR Chest Ap Portable  Final Result  IMPRESSION:   Mild bilateral basilar densities are present, likely secondary to subsegmental atelectasis and less likely developing infiltrates.    Electronically Signed by: Charlie Patch on 12/05/2020 7:39 AM     Labs: Labs Reviewed  CBC AND DIFFERENTIAL - Abnormal; Notable for the following components:      Result Value   IG% 0.700 (*)    IG ABSOLUTE 0.050 (*)    All other components within normal limits  COMPREHENSIVE METABOLIC PANEL - Abnormal; Notable for the following components:   Glucose 139 (*)    All other components within normal limits  PROTIME-INR - Abnormal; Notable for the following components:   PT 11.7 (*)    All other components within normal limits  GEN5 CARDIAC TROPONIN T (TNT5) BASELINE - Normal  MAGNESIUM - Normal  GEN5 CARDIAC TROPONIN T(TNT5) 1 HOUR - Normal  GEN5 CARDIAC TROPONIN T(TNT5) 3 HOUR - Normal  EXTRA TUBES   Narrative:    The following orders were created for panel order ED Tube Set / Rainbow. Procedure                               Abnormality         Status                    ---------                               -----------         ------                    Sharie Carbon Une[[id number]]                                  Final result  Gold DDU[[id number]]                                        Final result               Please view results for these tests on the individual orders.  LIGHT BLUE TOP  GOLD SST    ED COURSE AND TREATMENT:  Patient's condition remained stable during Emergency Department evaluation.   Previous medical records requested. 04/13/20 for nephrolithiasis  Orders written.  Diagnostics reviewed.   Medications  aspirin chewable tablet 162 mg (162 mg Oral Given 12/05/20 0708)  nitroGLYCERIN (NITROSTAT) SL tablet 0.4 mg (0.4 mg Sublingual Given 12/05/20 0707)  ondansetron  (ZOFRAN ) injection 4 mg (4 mg IntraVENous Given 12/05/20 0708)  oxyCODONE  HCl (ROXICODONE ) immediate release tablet 10 mg (10 mg Oral Given 12/05/20 0849)  acetaminophen  (TYLENOL ) tablet 650 mg (650 mg Oral Given 12/05/20 0849)    BP (!) 164/71 Comment: RN notified.   Pulse 59   Temp 98 F (36.7 C) (Oral)   Resp 22    Ht 1.803 m (5' 11)   Wt 104.3 kg (230 lb)   SpO2 94%   BMI 32.08 kg/m   ED Course as of 12/05/20 1325  Shane Xiong's Documentation  Wed Dec 05, 2020  0700 ECG 12 lead Normal sinus rhythm, rate 63, PR 182, QRS 128, QTc 466, normal axis.  No STEMI.  Right bundle branch block noted  0817 Reassessed patient. In no acute distress. Nitroglycerin did not change pain. States he normally takes Percocet for his pain, however his pain meds has been reduced from 90 to 30 pills monthly. States he is utilizing it more than often and needs more. If he is out, he normally purchases it from the streets. Presentation likely related due to chronic pain. Will redose once here with oxicodone pending troponins.  1101 Awaiting call back from his cardiologist, Dr. Hanna  1146 Cardiologist, Dr. Hanna returned call. With no delta changes and acute ST changes, can be discharged. She will call the patient for a follow up visit including stress test as outpatient.  I have provided the plans with the patient who is agreeable with the plans. Pain improved with oxycodone .  Strict return precautions were provided to the patient and significant other who understands and agrees with the plans.   CLINICAL IMPRESSION: 1. Atypical chest pain      CONDITION ON DISCHARGE:  Stable  PLAN AND FOLLOW-UP:   The evaluation and treatment you received in the Emergency Department was rendered on an urgent or emergent basis only. Your evaluation and treatment today was not intended to be a substitute for, or an effort to provide complete continuing medical care. It is very important that you establish care or follow up with a primary care provider for new, remaining or continuing medical problems because it is not always possible to recognize and treat all elements of injuries or illnesses in a single visit. Please see your primary care provider in the next 1-2 days and arrange for other follow-up care as we discussed.    Return to  the ED immediately without fail if your symptoms get worse, do not get better, or any other symptoms you feel need to be evaluated in the emergency department.   If you do not have a primary care provider, please follow up with:  [facility] Department or  [facility]  It is very important for you to follow-up with your cardiologist, Dr. Gomadame and your primary care within 1 week. You should be receiving a call from Dr. Marine office for a follow up visit. If you don't hear from her within 1-2 days, call the office for an update.  PRESCRIPTIONS:    Medication List    START taking these medications   naproxen 500 mg tablet Commonly known as: NAPROSYN Take one tablet (500 mg dose) by mouth 2 (two) times daily for 10 days.     ASK your doctor about these medications   ACCU-CHEK AVIVA PLUS test strip Generic drug: glucose blood Checking 4 times per day as instructed   amLODIPine besylate 5 mg tablet Commonly known as: NORVASC TAKE 1 TABLET DAILY   Cinnamon 500 MG Tabs   ferrous sulfate 325 (65 FE) MG tablet take 1 tablet by mouth twice a day   hydrochlorothiazide 12.5 MG capsule Commonly known as: MICROZIDE Take one capsule (12.5 mg dose) by mouth daily.   Insulin Syringe-Needle U-100 31G X 5/16 0.3 ML Misc Commonly known as: TRUEPLUS INSULIN SYRINGE 1 injection by Does not apply route 2 (two) times daily. Use one syringe to inject insulin 2 times daily   levothyroxine sodium 100 mcg tablet Commonly known as: SYNTHROID,LEVOTHROID,LEVOXYL TAKE 1 TABLET EVERY DAY   metformin 1000 MG tablet Commonly known as: GLUCOPHAGE Take 1 tablet twice daily with meals   metoprolol tartrate 100 mg tablet Commonly known as: LOPRESSOR TAKE 1 TABLET TWICE A DAY   multivitamin capsule   NOVOLIN N [place]   pravastatin sodium 40 mg tablet Commonly known as: PRAVACHOL TAKE 1 TABLET EVERY EVENING   Vitamin D (Cholecalciferol) 50 MCG (2000 UT) Caps    XARELTO 20 mg Tabs Generic drug: rivaroxaban Take one tablet (20 mg dose) by mouth daily.       Where to Get Your Medications    You can get these medications from any pharmacy   Bring a paper prescription for each of these medications naproxen 500 mg tablet    MEDICAL DECISION MAKING:: MDM Number of Diagnoses or Management Options Atypical chest pain Diagnosis management comments: A 60 year old male presents to the ED with a chief complaint of chest pain.  Upon ED presentation, the patient is hemodynamically stable, is afebrile, and is in no acute distress.  Physical examination reveals a nontoxic-appearing male.  Patient does have an extensive cardiac history.  Patient states he has chest pain for over 10 years now.  Patient does have good follow-up with cardiologist, Dr. Gomadame.  EKG shows no acute ST changes, high sensitive troponin x3 shows no delta changes.  The patient has no acute hypoxia or respiratory distress, I have low suspicion for PE.  Chest x-ray shows no acute cardiopulmonary process.  Otherwise, no evidence of leukocytosis for infectious etiology and no significant electrolyte abnormalities.  Patient states he was normally prescribed Percocet 90 count monthly, and now they have decrease it to 30 count monthly only.  Patient states pain is not controlled with the amount that he has.  Etiology could be underlying to pain control.  Patient was provided symptomatic control within the ED.  I have discussed with the patient's cardiologist, Dr. Gomadame who reviewed case. States can be discharged to follow up with her and she will perform outpatient testing.  I have discussed the plans with the patient and significant other who both understands and agrees with the plan.  Reassessment  of the patient is reassuring.  At this time, there are no immediate life-threatening conditions within the ED.  Strict return precautions were provided to the patient and significant other who both  understands and agrees with plan.  The patient is hemodynamically stable for discharge.    Amount and/or Complexity of Data Reviewed Clinical lab tests: ordered and reviewed Tests in the radiology section of CPT: ordered and reviewed Review and summarize past medical records: yes Discuss the patient with other providers: yes Independent visualization of images, tracings, or specimens: yes  Patient Progress Patient progress: stable  The patient was seen, evaluated and managed by ED attending physician, Ludie Primmer, D.O.    Ludie Primmer, DO 12/05/20 1325

## 2021-01-11 NOTE — ED Provider Notes (Signed)
 [facility]  ED Provider Note  Nathan Durham. 60 y.o. male DOB: 1960/03/28 MRN: [id number] History   Chief Complaint  Patient presents with  . Chest Pain    +dizziness, SHOB, constant pain.    60 year old man presents complaining of chest pain.  Reports a history of daily chest pain for the past 9 years.  Reports this episode of chest pain did have some mild shortness of breath and dizziness.  States the pain has improved since arrival.  Denies fevers but endorses cough.  Reports having triple bypass 5 years ago.  Last stress test was around that time.  He has a cardiologist in [place] with [facility] that he follows with regularly.  Patient also endorses a history of chronic pain which has been very debilitating and difficult to manage.  He has seen multiple specialist and there have been potential diagnoses of autoimmune conditions, which do run in his family.  Endorses that he has been on steroids that usually helps his generalized pain.  He is currently in the process of an autoimmune work-up with his PCP at [facility], where he also receives his chronic pain medicine.  Does endorse that he feels like his allotment of pills should be higher than he receives.  History of coronary artery disease, autoimmune conditions, diabetes, atrial fibrillation status post cardioversion  Denies cigarettes, alcohol occasionally, denies illicit drug use       Past Medical History:  Diagnosis Date  . Asthma   . CAD (coronary artery disease)   . Carpal tunnel syndrome, bilateral   . Chronic back pain   . Chronic bilateral low back pain without sciatica   . Chronic pain syndrome   . Crohn's disease (*)   . DDD (degenerative disc disease), cervical   . Diabetes mellitus (*)    IDDM  . Disease of thyroid gland   . Dupuytren's contracture of right hand   . Fibromyalgia   . GERD (gastroesophageal reflux disease)   . Gout   . Hypertension   . IBD  (inflammatory bowel disease)   . IDA (iron deficiency anemia)   . Kidney stone   . Neuropathy   . Paroxysmal atrial fibrillation (*)   . Polymyalgia rheumatica (*)   . Polymyositis (*) 02/03/2014  . 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 Vitamin D deficiency     Past Surgical History:  Procedure Laterality Date  . Cardiac catheterization  08/14/2015   3 vessel CAD  . Colonoscopy  2015   [facility]   . Coronary artery bypass graft  09/04/2015   CORONARY ARTERY BYPASS GRAFT X3 WITH MAZE, PULMONARY VEIN ISOLATION AND LEFT ATRIAL APPENDAGE LIGATION - Dr. Joline  . Cystoscopy w/ ureteroscopy w/ lithotripsy     x2  . Endoscopy    . Endoscopy wrist surg; w/releas transver ligamt Right 10/21/2016  . Fasciect part palm w/rel 1 digit; ea add digit Right 10/21/2016  . Fasciect part palmar w/rel 1 digt w/wo z-plasty Right 10/21/2016  . Open treatment of femoral fracture, proximal end, neck, internal fixation or prosthetic replacement Left 07/24/2016  . Partial hip arthroplasty    . Right endoscopic carpal tunnel release, ulnar nerve transposition, dupyten's release palmer, ring, long and fifth finger Right 10/21/2016  . Upper gastrointestinal endoscopy  2015   [facility]    Social History   Substance and Sexual Activity  Alcohol Use Not Currently   Social History   Tobacco Use  Smoking Status Never  Smoker  Smokeless Tobacco Never Used   E-Cigarettes  . Vaping Use Never User   . Start Date    . Cartridges/Day    . Quit Date     Social History   Substance and Sexual Activity  Drug Use No         Allergies  Allergen Reactions  . Celebrex [Celecoxib] Palpitations and Other    palpitations Chest tight-   . Dilaudid  [Hydromorphone  Hcl] Hypotension  . Cymbalta [Duloxetine Hcl] Nausea And Vomiting  . Pregabalin Other    Uncontrolled blood sugar     Home Medications   ACCU-CHEK AVIVA PLUS TEST STRIP    Checking 4 times per day as instructed   AMLODIPINE BESYLATE  (NORVASC) 5 MG TABLET    TAKE 1 TABLET DAILY   CINNAMON 500 MG TABS    Take 500 mg by mouth 2 (two) times daily.   FERROUS SULFATE 325 (65 FE) MG EC TABLET    take 1 tablet by mouth twice a day   HYDROCHLOROTHIAZIDE (MICROZIDE) 12.5 MG CAPSULE    Take one capsule (12.5 mg dose) by mouth daily.   INSULIN NPH HUMAN, ISOPHANE, (NOVOLIN N [place])    Inject into the skin. 30 units in the am and 15 in the afternoon   INSULIN SYRINGE-NEEDLE U-100 (TRUEPLUS INSULIN SYRINGE) 31G X 5/16 0.3 ML MISC    1 injection by Does not apply route 2 (two) times daily. Use one syringe to inject insulin 2 times daily   LEVOTHYROXINE SODIUM (SYNTHROID,LEVOTHROID,LEVOXYL) 100 MCG TABLET    TAKE 1 TABLET EVERY DAY   METFORMIN (GLUCOPHAGE) 1000 MG TABLET    Take 1 tablet twice daily with meals   METOPROLOL TARTRATE (LOPRESSOR) 100 MG TABLET    TAKE 1 TABLET TWICE A DAY   MULTIPLE VITAMIN (MULTIVITAMIN) CAPSULE    Take 1 capsule by mouth daily.   NAPROXEN (NAPROSYN) 500 MG TABLET    Take one tablet (500 mg dose) by mouth 2 (two) times daily for 10 days.   PRAVASTATIN SODIUM (PRAVACHOL) 40 MG TABLET    TAKE 1 TABLET EVERY EVENING   RIVAROXABAN (XARELTO) 20 MG TABS    Take one tablet (20 mg dose) by mouth daily.   VITAMIN D, CHOLECALCIFEROL, 50 MCG (2000 UT) CAPS    Take by mouth daily.    Review of Systems   Review of Systems  Constitutional: Negative for fever.  HENT: Negative for congestion.   Eyes: Negative for visual disturbance.  Respiratory: Positive for cough and shortness of breath.   Cardiovascular: Positive for chest pain. Negative for palpitations and leg swelling.  Gastrointestinal: Negative for abdominal pain, diarrhea, nausea and vomiting.  Endocrine: Negative for polyuria.  Genitourinary: Negative for dysuria.  Musculoskeletal: Positive for myalgias. Negative for back pain and neck pain.  Skin: Negative for wound.  Neurological: Positive for dizziness. Negative for weakness, numbness and headaches.  All  other systems reviewed and are negative.   Physical Exam   ED Triage Vitals [01/11/21 1701]  BP 148/77  Heart Rate 59  Resp 18  SpO2 100 %  Temp 97.4 F (36.3 C)    Physical Exam  Constitutional: He appears well-developed and well-nourished. He does not appear distressed, does not appear ill and no respiratory distress. Not diaphoretic. HENT:  Head: Normocephalic and atraumatic.  Nose: Nose normal.  Mouth/Throat: Moist mucous membrane. Voice normal.  Eyes: EOM are intact. Conjunctivae are normal. Pupils are equal, round, and reactive to light.  Neck: Normal  range of motion and voice normal. Neck supple. No nuchal rigidity.  Cardiovascular: Normal rate, regular rhythm, normal heart sounds and intact distal pulses.  No audible murmur.  Pulmonary/Chest: No respiratory distress. Decreased air movement (globally) noted. Not tachypneic. Respiratory effort normal and breath sounds normal. No chest wall tenderness.  Abdominal: Soft. There is no abdominal tenderness. Bowel sounds are normal.  Musculoskeletal: Normal range of motion. No obvious deformity noted to extremities.     Cervical back: Normal range of motion and neck supple.     Comments: No calf tenderness or sign of DVT no edema.  Neurological: He is alert and oriented to person, place, and time. Moves all extremities equally. Gait normal. He has normal speech. Cranial nerves intact II through XII. Sensation intact to light touch, bilateral upper and lower extremities. Strength 5/5 bilateral upper and lower extremities.  Skin: Skin is warm. Not diaphoretic. Skin is dry.  Psychiatric: He has a normal mood and affect. His behavior is normal.    ED Course   Lab results:   CBC AND DIFFERENTIAL - Abnormal      Result Value   WBC 9.1     RBC 5.33     HGB 16.2     HCT 47.3     MCV 89     MCH 30.4     MCHC 34.2     Plt Ct 279     RDW SD 43.2     MPV 9.3     NRBC% 0.0     NRBC 0.000     NEUTROPHIL % 56.0     LYMPHOCYTE %  30.3     MONOCYTE % 10.9     Eosinophil % 1.7     BASOPHIL % 0.8     IG% 0.300     ABSOLUTE NEUTROPHIL COUNT 5.10     ABSOLUTE LYMPHOCYTE COUNT 2.8     MONO ABSOLUTE 1.0 (*)    EOS ABSOLUTE 0.2     BASO ABSOLUTE 0.1     IG ABSOLUTE 0.030    COMPREHENSIVE METABOLIC PANEL - Abnormal   Na 139     Potassium 3.8     Cl 99     CO2 26     AGAP 14     Glucose 106 (*)    BUN 19     Creatinine 0.94     Ca 10.0     ALK PHOS 66     T Bili 0.49     Total Protein 7.7     Alb 4.6     GLOBULIN 3.1     ALBUMIN/GLOBULIN RATIO 1.5     BUN/CREAT RATIO 20.2     ALT 21     AST 20     eGFR 92     Comment: Normal GFR (glomerular filtration rate) > 60 mL/min/1.73 meters squared, < 60 may include impaired kidney function. Calculation based on the Chronic Kidney Disease Epidemiology Collaboration (CK-EPI)equation refit without adjustment for race.  GEN5 CARDIAC TROPONIN T (TNT5) BASELINE - Normal   TnT-Gen5 (0hr) 13     Comment: An elevated Troponin indicates myocardial damage. Elevated troponin may also be due to pulmonary emboli, aortic dissection, heart failure, trauma, toxins and ischemia in the setting of critical illness.   MAGNESIUM - Normal   Mg 1.9    GEN5 CARDIAC TROPONIN T(TNT5) 1 HOUR - Normal   TnT-Gen5 (1hr) 13     Comment: An elevated Troponin indicates myocardial damage. Elevated troponin may  also be due to pulmonary emboli, aortic dissection, heart failure, trauma, toxins and ischemia in the setting of critical illness.   Delta 1 Hour 0    GEN5 CARDIAC TROPONIN T(TNT5) 3 HOUR - Normal   TnT-Gen5 (3hr) 14     Comment: An elevated Troponin indicates myocardial damage. Elevated troponin may also be due to pulmonary emboli, aortic dissection, heart failure, trauma, toxins and ischemia in the setting of critical illness.   Delta 3 Hour 1      Imaging:   XR CHEST AP PORTABLE   Narrative:    INDICATION: Chest Pain  COMPARISON: 12/05/2020  TECHNIQUE: Single AP view of the  chest.  FINDINGS: No evidence of acute infiltrate, pleural fluid, or pneumothorax. Mediastinal and hilar contours. Prior CABG. Atrial clip. No acute osseous abnormality.    Impression:    IMPRESSION: 1. No evidence of acute intrathoracic disease.  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) Abnormal ECG When compared with ECG of 11-Jan-2021 19:50, No significant change was found                     ECG 12 lead (In process)  Result time 01/11/21 20:55:03   In process            Narrative:   Diagnosis Class Abnormal Acquisition Device D3K Systolic BP 169 Diastolic BP 69 Ventricular Rate 70 Atrial Rate 70 P-R Interval 124 QRS Duration 132 Q-T Interval 434 QTC Calculation(Bazett) 468 Calculated P Axis 81 Calculated R Axis -15 Calculated T Axis 50  Diagnosis Normal sinus rhythm Right bundle branch block 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) Abnormal ECG When compared with ECG of 11-Jan-2021 16:56, No significant change was found                     ECG 12 lead (In process)  Result time 01/11/21 18:17:01   In process            Narrative:   Diagnosis Class Abnormal Acquisition Device MV360 Ventricular Rate 56 Atrial Rate 56 P-R Interval 194 QRS Duration 132 Q-T Interval 432 QTC Calculation(Bazett) 416 Calculated P Axis 50 Calculated R Axis -14 Calculated T Axis 61  Diagnosis Sinus bradycardia Right bundle branch block Inferior infarct (cited on or  before 05-Dec-2020) Abnormal ECG When compared with ECG of 05-Dec-2020 10:15, No significant change was found                   HEAR Score  History: Mix of high & low features ECG: Non specific repolarisation disturbance Age: 63-64 yrs Risk Factors: More than or equal 3 risk factors or history of atherosclerotic disease HEAR Score Total: 5              Pre-Sedation Procedures  ED Course as of 01/11/21 2225  Nathan Durham's Documentation  Fri Jan 11, 2021  1924 Differential includes ACS, bronchitis, pneumonia, electrolyte abnormality, hypertensive  urgency/emergency  1931 EKG shows sinus rhythm rate of 64, right bundle branch block, left axis deviation, QTC is 460, similar to prior on 12/05/2020, no STEMI  1950 EKG shows sinus rhythm rate of 70, right bundle branch block, left axis deviation, QTC is 468, no STEMI  2116 BP: 121/61 improved  2224 Patient reassessed.  Doing well.  He feels much better.  Does feel like he got some relief from the breathing treatments.  Since he has a cough, this may be bronchitis.  Discussed starting a course of steroids and albuterol  inhaler given his risk factors for cardiac disease it is very important he follows up.  Order placed for 72-hour follow-up, patient is also been a contact his own cardiologist to see who he can see sooner.  Advised of strict return precautions including but not limited to any new or worsening symptoms.  Patient is hemodynamically stable, vitals improved since hydralazine and appropriate for outpatient management.  All questions answered to satisfaction.  Discharged in stable condition.   MDM Coding      Provider Communication  New Prescriptions   PREDNISONE  (DELTASONE ) 20 MG TABLET    Take two tablets (40 mg dose) by mouth daily for 5 days.      Quantity: 10 tablet    Refills: 0    Modified Medications   No medications on file    Discontinued Medications   No medications on file     Clinical Impression   Final diagnoses:  Chest pain, unspecified type  Bronchitis    ED Disposition    ED Disposition  Discharge   Condition  Stable   Comment  --                Follow-up Information    Ambulatory Referral to [facility] for ED 72 Hr Chest Pain F/U.   Comments: Chest pain, ACS ruled out in ED       Bernardino JONETTA Pinal, PA. Schedule an appointment as soon as possible for a visit in 2 days.   Specialty: Emergency Medicine Comments: Re-evaluation Contact information: [address] [place] [address] [phone]        Go to  [facility] Emergency [facility].   Specialty: Emergency Medicine Comments: As needed, If symptoms worsen Contact information: [address] [place]  [id number] [phone]               Electronically signed by:   Jenna N Santiago-Wickey, DO 01/11/21 2226

## 2023-04-06 NOTE — ED Provider Notes (Addendum)
 [facility]  ED Provider Note Medical screening exam initiated in triage. HPI reviewed. Vital signs reviewed. Reports heart rate dropping into the 50s with some associated dizziness.  Reports that his medications were just increased because his heart rate was elevated.  It appears that he was switched from Losartan to Cardizem 1/9.   Patient stable and in no distress in triage. Olivia Blue, PA-C 2:55 PM 04/06/2023    Debby Clem Wilburn Mickey. 63 y.o. male DOB: 07-Oct-1960 MRN: [id number] History   Chief Complaint  Patient presents with  . Bradycardia    Pt checked his HR on his pulse ox at home showing his HR is getting down to the 50s. Pt has had a medications change about 1 week ago. C/o dizziness   Pt has hx paroxysmal aflutter/afib on xarelto, COPD on prednisone  10mg  daily, CAD s/p CABG, maze, PVI, appendage ligation, DM on insulin, hypothyroidism. Pt said he's never smoked but always lived around smokers. Pt comes to the ED for HR as low as 51bpm this morning and lightheadedness although denies he felt like he was going to pass out. He said he has dyspnea all the time which is unchanged as well as intermittent and is a cramp feeling and has also been present for a long time and is not associated with activity or exertion. He saw his cardiologist recently.  He was then atrial flutter/fib with rate of 104.  His amlodipine was stopped and he was started on diltiazem ER 180 mg daily and was referred to EP for possible flutter ablation.    History provided by:  Patient and medical records Dizziness Quality:  Lightheadedness Associated symptoms: chest pain and shortness of breath       Past Medical History:  Diagnosis Date  . Asthma   . CAD (coronary artery disease)   . Carpal tunnel syndrome, bilateral   . Chronic back pain   . Chronic bilateral low back pain without sciatica   . Chronic pain syndrome   . COPD (chronic obstructive pulmonary disease)  (*)   . Crohn's disease (*)    no sign at last 2 scopes  . DDD (degenerative disc disease), cervical   . Diabetes mellitus (*)    IDDM  . Disease of thyroid gland   . Dupuytren's contracture of right hand   . Fibromyalgia   . GERD (gastroesophageal reflux disease)   . Gout   . Hypertension   . IBD (inflammatory bowel disease)   . IDA (iron deficiency anemia)   . Kidney stones   . Muscle pain    pt stated chronic pain  . Neuropathy   . OSA (obstructive sleep apnea)    no CPAP - cost  . Paroxysmal atrial fibrillation (*)   . Polymyalgia rheumatica (*)   . Polymyositis (*) 02/03/2014  . RSV (respiratory syncytial virus infection) 03/25/2022  . 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 Vitamin D deficiency     Past Surgical History:  Procedure Laterality Date  . Cardiac catheterization  08/14/2015   3 vessel CAD  . Colonoscopy  2015   [facility]   . Coronary artery bypass graft  09/04/2015   CORONARY ARTERY BYPASS GRAFT X3 WITH MAZE, PULMONARY VEIN ISOLATION AND LEFT ATRIAL APPENDAGE LIGATION - Dr. Joline  . Cystoscopy w/ ureteral stent placement  03/26/2022  . Cystoscopy w/ ureteroscopy w/ lithotripsy     x2  . Endoscopy    . Endoscopy wrist surg; w/releas transver ligamt Right  10/21/2016  . Extracorporeal shock wave lithotripsy Left 03/19/2018  . Fasciect part palm w/rel 1 digit; ea add digit Right 10/21/2016  . Fasciect part palmar w/rel 1 digt w/wo z-plasty Right 10/21/2016  . Open treatment of femoral fracture, proximal end, neck, internal fixation or prosthetic replacement Left 07/24/2016  . Right endoscopic carpal tunnel release, ulnar nerve transposition, dupyten's release palmer, ring, long and fifth finger Right 10/21/2016  . Upper gastrointestinal endoscopy  2015   [facility]    Social History   Substance and Sexual Activity  Alcohol Use Yes   Comment: occasionally when I have kidney stones   Social History   Tobacco Use  Smoking Status Never  . Passive  exposure: Never  Smokeless Tobacco Never   E-Cigarettes  . Vaping Use Never User   . Start Date    . Cartridges/Day    . Quit Date     Social History   Substance and Sexual Activity  Drug Use Never         Allergies  Allergen Reactions  . Celebrex [Celecoxib] Palpitations and Other    palpitations Chest tight-   . Hydromorphone  Hcl Hypotension  . Cymbalta [Duloxetine Hcl] Nausea And Vomiting  . Duloxetine Nausea And Vomiting  . Lyrica [Pregabalin] Other    Uncontrolled blood sugar     Discharge Medication List as of 04/06/2023  7:24 PM     CONTINUE these medications which have NOT CHANGED   Details  ACCU-CHEK AVIVA PLUS test strip Checking 4 times per day as instructed, Normal    albuterol  sulfate HFA (PROVENTIL ,VENTOLIN ,PROAIR ) 108 (90 Base) MCG/ACT inhaler Inhale two puffs into the lungs every 4 (four) hours as needed., Historical Med    Alcohol Swabs (DROPSAFE ALCOHOL PREP) 70 % PADS Apply topically., Starting Fri 03/28/2022, Historical Med    aspirin (ECOTRIN LOW DOSE) EC tablet Take one tablet (81 mg dose) by mouth daily., Starting Fri 03/20/2023, Historical Med    atorvastatin (LIPITOR) 40 mg tablet Take one tablet (40 mg dose) by mouth daily., Starting Fri 03/28/2022, Historical Med    Blood Glucose Calibration (TRUE METRIX LEVEL 1) Low SOLN Historical Med    Blood Glucose Monitoring Suppl (TRUE METRIX AIR GLUCOSE METER) w/Device KIT Historical Med    !! diltiazem HCl (CARDIZEM CD) 180 mg 24 hr capsule Take one capsule (180 mg dose) by mouth daily., Starting Thu 03/26/2023, Normal    furosemide (LASIX) 40 mg tablet Take one tablet (40 mg dose) by mouth daily., Starting Thu 02/05/2023, Historical Med    Insulin NPH Human, Isophane, (NOVOLIN N [place]) Inject into the skin. 30 units in the am and 15 in the evening, Historical Med    insulin regular (HUMULIN R,NOVOLIN R) 100 units/mL injection Inject into the skin 30 (thirty) minutes before meals. Takes 2 to 4 units - if  FSBS is >180, Historical Med    Insulin Syringe-Needle U-100 (TRUEPLUS INSULIN SYRINGE) 31G X 5/16 0.3 ML MISC 1 injection by Does not apply route 2 (two) times daily. Use one syringe to inject insulin 2 times daily, Starting Fri 07/15/2019, Normal    levothyroxine sodium (SYNTHROID,LEVOTHROID,LOVOXYL) 50 mcg tablet Take one tablet (50 mcg dose) by mouth daily., Historical Med    losartan potassium (COZAAR) 25 mg tablet Take one tablet (25 mg dose) by mouth daily., Starting Mon 12/23/2021, Historical Med    metformin (GLUCOPHAGE) 1000 MG tablet Take 1 tablet twice daily with meals, Normal    metoprolol tartrate (LOPRESSOR) 100 mg tablet Take  one tablet (100 mg dose) by mouth 2 (two) times daily., Historical Med    Multiple Vitamin (MULTIVITAMIN) capsule Take one capsule by mouth daily., Historical Med    ondansetron  (ZOFRAN -ODT) 4 mg disintegrating tablet Take one tablet (4 mg dose) by mouth every 8 (eight) hours as needed., Starting Tue 09/30/2022, Normal    potassium chloride (K-DUR,KLOR-CON) 20 mEq CR tablet Take one tablet (20 mEq dose) by mouth daily., Starting Mon 03/09/2023, Historical Med    predniSONE  (DELTASONE ) 20 mg tablet Take one half tablet (10 mg dose) by mouth every morning., Starting Fri 03/28/2022, Historical Med    rivaroxaban (XARELTO) 20 mg TABS tablet Take one tablet (20 mg dose) by mouth daily., Starting Thu 04/02/2023, Until Fri 04/01/2024, Normal    tamsulosin (FLOMAX) 0.4 mg CAPS Take one capsule (0.4 mg dose) by mouth daily for 30 days., Starting Sat 03/21/2023, Until Mon 04/20/2023, Normal    TRUEPLUS LANCETS 33G MISC Apply 1 each topically 3 (three) times a day., Starting Fri 03/28/2022, Historical Med     !! - Potential duplicate medications found. Please discuss with provider.      Primary Survey  Primary Survey  Review of Systems   Review of Systems  Constitutional:  Negative for fever.  Respiratory:  Positive for shortness of breath. Negative for cough.    Cardiovascular:  Positive for chest pain. Negative for leg swelling.  Gastrointestinal:  Negative for abdominal pain.  Neurological:  Positive for dizziness and light-headedness. Negative for syncope, weakness and numbness.    Physical Exam   ED Triage Vitals [04/06/23 1456]  BP (!) 125/53  Heart Rate 57  Resp 18  SpO2 100 %  Temp 98 F (36.7 C)    Physical Exam  Nursing note and vitals reviewed. Constitutional: He appears well-developed and well-nourished.  HENT:  Head: Normocephalic and atraumatic.  Eyes: Pupils are equal, round, and reactive to light.  Cardiovascular: Normal rate and regular rhythm.  Pulmonary/Chest: Decreased air movement noted. Respiratory effort normal.  Musculoskeletal: 1+ edema.  Neurological: He is alert and oriented to person, place, and time. Moves all extremities equally. He has normal speech.  Skin: Skin is warm. Skin is dry.  Psychiatric: He has a normal mood and affect. His behavior is normal.    ED Course   Lab results:   CBC AND DIFFERENTIAL - Abnormal      Result Value   WBC 10.7 (*)    RBC 4.64     HGB 13.8     HCT 41.8     MCV 90.1     MCH 29.7     MCHC 33.0     Plt Ct 304     RDW SD 46.4 (*)    MPV 9.2 (*)    NRBC% 0.0     Absolute NRBC Count 0.00     NEUTROPHIL % 86.0     LYMPHOCYTE % 7.5     MONOCYTE % 5.4     Eosinophil % 0.1     BASOPHIL % 0.3     IG% 0.7     ABSOLUTE NEUTROPHIL COUNT 9.23 (*)    ABSOLUTE LYMPHOCYTE COUNT 0.81 (*)    Absolute Monocyte Count 0.58     Absolute Eosinophil Count 0.01     Absolute Basophil Count 0.03     Absolute Immature Granulocyte Count 0.07 (*)   COMPREHENSIVE METABOLIC PANEL - Abnormal   Na 137     Potassium 4.3     Cl 97  CO2 26     AGAP 14     Glucose 176 (*)    BUN 15     Creatinine 0.93     Ca 9.4     ALK PHOS 77     T Bili 0.5     Total Protein 6.5     Alb 4.0     GLOBULIN 2.5     ALBUMIN/GLOBULIN RATIO 1.6     BUN/CREAT RATIO 16.1     ALT 27     AST 19      eGFR 92     Comment: Normal GFR (glomerular filtration rate) > 60 mL/min/1.73 meters squared, < 60 may include impaired kidney function. Calculation based on the Chronic Kidney Disease Epidemiology Collaboration (CK-EPI)equation refit without adjustment for race.  GEN5 CARDIAC TROPONIN T (TNT5) BASELINE - Abnormal   TnT-Gen5 (0hr) 22 (*)    Comment: An elevated Troponin indicates myocardial damage. Elevated troponin may also be due to pulmonary emboli, aortic dissection, heart failure, trauma, toxins and ischemia in the setting of critical illness.   GEN5 CARDIAC TROPONIN T(TNT5) 1 HOUR - Abnormal   TnT-Gen5 (1hr) 22 (*)    Comment: An elevated Troponin indicates myocardial damage. Elevated troponin may also be due to pulmonary emboli, aortic dissection, heart failure, trauma, toxins and ischemia in the setting of critical illness.   Delta 1 Hour 0    MAGNESIUM - Normal   Mg 1.8    GEN5 CARDIAC TROPONIN T(TNT5) 3 HOUR - Normal   TnT-Gen5 (3hr) 21     Comment: An elevated Troponin indicates myocardial damage. Elevated troponin may also be due to pulmonary emboli, aortic dissection, heart failure, trauma, toxins and ischemia in the setting of critical illness.   Delta 3 Hour -1    LIGHT BLUE TOP  LAVENDER TOP  MINT GREEN-TOP TUBE (PST GEL/LI HEP)  GOLD SST    Imaging:   XR CHEST PA AND LATERAL   Narrative:    TECHNIQUE:  XR CHEST PA AND LATERAL  INDICATION: Weakness   FINDINGS:  XR CHEST   Enlarged cardiomediastinal silhouette.   No pulmonary venous congestion.  Mild bilateral basilar subsegmental pulmonary opacities.  No pleural effusions identified.  No pneumothorax identified.  Median sternotomy wires. Left atrial appendage clip.    Impression:    IMPRESSION:  XR CHEST  Mild bilateral basilar densities are present, likely secondary to subsegmental atelectasis and less likely developing infiltrates.    Electronically Signed by: Charlie Patch, MD on 04/06/2023  3:34 PM    ECG: ECG Results          ECG 12 lead (Final result)  Result time 04/06/23 19:20:49    Final result             Narrative:   Diagnosis Class Abnormal Acquisition Device D3K Systolic BP 130 Diastolic BP 60 Ventricular Rate 60 Atrial Rate 60 P-R Interval 174 QRS Duration 130 Q-T Interval 434 QTC Calculation(Bazett) 434 Calculated P Axis 65 Calculated R Axis 45 Calculated T Axis 51  Diagnosis Normal sinus rhythm Right bundle branch block Possible Inferior infarct (cited on or before 06-Apr-2023) Abnormal ECG When compared with ECG of 06-Apr-2023 16:30, No significant change was found Whitney Factor ([id number]) on 04/06/2023 7:20:38 PM certifies that he/she has reviewed the ECG tracing and confirms the independent  interpretation is correct.                         ECG 12  lead (Final result)  Result time 04/06/23 19:20:39    Final result             Narrative:   Diagnosis Class Abnormal Acquisition Device D3K Systolic BP 154 Diastolic BP 67 Ventricular Rate 57 Atrial Rate 57 P-R Interval 182 QRS Duration 126 Q-T Interval 430 QTC Calculation(Bazett) 418 Calculated P Axis 61 Calculated R Axis 41 Calculated T Axis 63  Diagnosis Sinus bradycardia Right bundle branch block Cannot rule out Inferior infarct (cited on or before 06-Apr-2023) Abnormal ECG When compared with ECG of 06-Apr-2023 16:06, No significant change was found Whitney Factor ([id number]) on 04/06/2023 7:20:27 PM certifies that he/she has reviewed the ECG tracing and confirms the independent  interpretation is correct.                         ECG 12 lead (Final result)  Result time 04/06/23 19:19:33    Final result             Narrative:   Diagnosis Class Abnormal Acquisition Device D3K Ventricular Rate 58 Atrial Rate 58 P-R Interval 174 QRS Duration 132 Q-T Interval 436 QTC Calculation(Bazett) 428 Calculated P Axis 60 Calculated R Axis 59 Calculated T  Axis 58  Diagnosis Sinus bradycardia Right bundle branch block Cannot rule out Inferior infarct , age undetermined Abnormal ECG When compared with ECG of 06-Apr-2023 15:09, No significant change was found Whitney Factor ([id number]) on 04/06/2023 7:19:22 PM certifies that he/she has reviewed the ECG tracing and confirms the independent  interpretation is correct.                         ECG 12 lead (Final result)  Result time 04/06/23 19:19:20    Final result             Narrative:   Diagnosis Class Abnormal Acquisition Device MV360 Ventricular Rate 56 Atrial Rate 56 P-R Interval 184 QRS Duration 130 Q-T Interval 436 QTC Calculation(Bazett) 420 Calculated P Axis 61 Calculated R Axis 79 Calculated T Axis 52  Diagnosis Sinus bradycardia Right bundle branch block Abnormal ECG When compared with ECG of 23-Mar-2023 15:29, Vent. rate has decreased BY  32 BPM no stemi mas 1510 Whitney Factor ([id number]) on 04/06/2023 7:19:09 PM certifies that he/she has reviewed the ECG tracing and confirms the independent  interpretation is correct.                      HEAR Score History: Mostly low risk features ECG: Non specific repolarisation disturbance Age: 65-64 yrs Risk Factors: More than or equal 3 risk factors or history of atherosclerotic disease HEAR Score Total: 4                                                        Pre-Sedation Procedures    Medical Decision Making The patient presents to the ED due to a low heart rate and feeling dizzy but denying any near syncope or syncope.  He has chronic chest pain and shortness of breath which is intermittent, spontaneous and denies that it is exertional.  He is on prednisone  10 mg daily, uses a nebulizer and is compliant with his medications.  He was recently initiated on diltiazem ER 180 mg daily due to  A-fib/flutter with uncontrolled rates up to 160 per the patient.  The medication has  clearly been working as his heart rate is now more controlled.  It is 65 bpm and normal sinus rhythm at the time of my exam.  I will check orthostatic vital signs and trend his troponins.  Neg orthostatics. Unremarkable ED workup. I explained to the patient a lower heart rate was the goal for this new medication. Since he is symptomatic with lightheadedness, I will send in a Rx for 120mg  dose of diltiazem to see if he tolerates that better. I discussed with him importance of stopping the 180mg  dose when he's taking the 120mg  dose. Will have him f/up in 72hr chest pain cardiology clinic for recheck and they can determine which dose to continue.  The differential diagnosis associated with the patient's presentation includes: Intended side effect of medication, ACS, electrolyte abnormality among other diagnoses  I considered admitting/observing and elected not to, pt stable for discharge with outpt f/up      Chronic condition affecting patient care: Past Medical History: No date: Asthma No date: CAD (coronary artery disease) No date: Carpal tunnel syndrome, bilateral No date: Chronic back pain No date: Chronic bilateral low back pain without sciatica No date: Chronic pain syndrome No date: COPD (chronic obstructive pulmonary disease) (*) No date: Crohn's disease (*)     Comment:  no sign at last 2 scopes No date: DDD (degenerative disc disease), cervical No date: Diabetes mellitus (*)     Comment:  IDDM No date: Disease of thyroid gland No date: Dupuytren's contracture of right hand No date: Fibromyalgia No date: GERD (gastroesophageal reflux disease) No date: Gout No date: Hypertension No date: IBD (inflammatory bowel disease) No date: IDA (iron deficiency anemia) No date: Kidney stones No date: Muscle pain     Comment:  pt stated chronic pain No date: Neuropathy No date: OSA (obstructive sleep apnea)     Comment:  no CPAP - cost No date: Paroxysmal atrial fibrillation (*) No  date: Polymyalgia rheumatica (*) 02/03/2014: Polymyositis (*) 03/25/2022: RSV (respiratory syncytial virus infection) 09/04/2015: S/P CABG x 3 No date: Vitamin D deficiency  Patient's care significantly limited by social determinants of health including : Other social determinant of health. Limited health literacy   Amount and/or Complexity of Data Reviewed External Data Reviewed: notes. Labs: ordered. Decision-making details documented in ED Course. Radiology: ordered and independent interpretation performed. Decision-making details documented in ED Course. ECG/medicine tests: ordered and independent interpretation performed. Decision-making details documented in ED Course.  Risk Prescription drug management.         Provider Communication  Discharge Medication List as of 04/06/2023  7:24 PM     START taking these medications   Details  !! diltiazem HCl (CARDIZEM CD) 120 mg 24 hr capsule Take one capsule (120 mg dose) by mouth daily. DO NOT TAKE YOUR CARDIZEM 180MG  CAPSULE WHILE TAKING THIS MEDICATION, Starting Mon 04/06/2023, Normal     !! - Potential duplicate medications found. Please discuss with provider.      Discharge Medication List as of 04/06/2023  7:24 PM      Discharge Medication List as of 04/06/2023  7:24 PM      Clinical Impression Final diagnoses:  Sinus bradycardia  Lightheadedness  Medication side effect, initial encounter    ED Disposition     ED Disposition  Discharge   Condition  Stable   Comment  --  Follow-up Information     Ambulatory Referral to [facility] for ED 72 Hr Chest Pain F/U .   Comments: Chest pain, ACS ruled out in ED        Patria CHRISTELLA Grit, MD .   Specialties: Internal Medicine, Infectious Diseases Contact information: [address] [place] [address] [phone]                  Electronically signed by:    [place] DELENA Chapel, DO 04/06/23 1933    [place] DELENA Chapel, DO 04/06/23 2144

## 2023-06-23 NOTE — ED Provider Notes (Signed)
 [facility]  ED Provider Note  Nathan Durham. 63 y.o. male DOB: 1960/06/02 MRN: [id number] History   Chief Complaint  Patient presents with  . Cough    Patient presents with cough, shortness of breath off and on all day.  States there is blood in his sputum when he coughs.     Patient is a 63 year old white male who presents with subjective fevers, chills, body aches and cough which has been productive of mucoid to yellow blood-tinged sputum.  Which started yesterday.  Patient has bilateral sharp aching chest pain which is mainly worse with coughing.  He states he is also felt somewhat short of breath.  Patient has had nausea and has had vomiting but the vomiting has been posttussive vomiting.  Patient does have a history of coronary disease and status post three-vessel CABG 8 years ago.  He has a history of COPD, diabetes and hypertension.       Past Medical History:  Diagnosis Date  . Asthma (*)   . CAD (coronary artery disease)   . Carpal tunnel syndrome, bilateral   . Chronic back pain   . Chronic bilateral low back pain without sciatica   . Chronic pain syndrome   . COPD (chronic obstructive pulmonary disease) (*)   . Crohn's disease (*)    no sign at last 2 scopes  . DDD (degenerative disc disease), cervical   . Diabetes mellitus (*)    IDDM  . Disease of thyroid gland   . Dupuytren's contracture of right hand   . Fibromyalgia   . GERD (gastroesophageal reflux disease)   . Gout   . Hypertension   . IBD (inflammatory bowel disease)   . IDA (iron deficiency anemia)   . Kidney stones   . Muscle pain    pt stated chronic pain  . Neuropathy   . OSA (obstructive sleep apnea)    no CPAP - cost  . Paroxysmal atrial fibrillation (*)   . Polymyalgia rheumatica (*)   . Polymyositis (*) 02/03/2014  . RSV (respiratory syncytial virus infection) 03/25/2022  . 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 Vitamin D deficiency     Past Surgical  History:  Procedure Laterality Date  . Cardiac catheterization  08/14/2015   3 vessel CAD  . Colonoscopy  2015   [facility]   . Coronary artery bypass graft  09/04/2015   CORONARY ARTERY BYPASS GRAFT X3 WITH MAZE, PULMONARY VEIN ISOLATION AND LEFT ATRIAL APPENDAGE LIGATION - Dr. Joline  . Cystoscopy w/ ureteral stent placement  03/26/2022  . Cystoscopy w/ ureteroscopy w/ lithotripsy     x2  . Endoscopy    . Endoscopy wrist surg; w/releas transver ligamt Right 10/21/2016  . Extracorporeal shock wave lithotripsy Left 03/19/2018  . Fasciect part palm w/rel 1 digit; ea add digit Right 10/21/2016  . Fasciect part palmar w/rel 1 digt w/wo z-plasty Right 10/21/2016  . Open treatment of femoral fracture, proximal end, neck, internal fixation or prosthetic replacement Left 07/24/2016  . Right endoscopic carpal tunnel release, ulnar nerve transposition, dupyten's release palmer, ring, long and fifth finger Right 10/21/2016  . Upper gastrointestinal endoscopy  2015   [facility]    Social History   Substance and Sexual Activity  Alcohol Use Yes   Comment: occasionally when I have kidney stones   Social History   Tobacco Use  Smoking Status Never  . Passive exposure: Never  Smokeless Tobacco Never   E-Cigarettes  .  Vaping Use Never User   . Start Date    . Cartridges/Day    . Quit Date     Social History   Substance and Sexual Activity  Drug Use Never         Allergies  Allergen Reactions  . Celebrex [Celecoxib] Palpitations and Other    palpitations Chest tight-   . Hydromorphone  Hcl Hypotension  . Cymbalta [Duloxetine Hcl] Nausea And Vomiting  . Duloxetine Nausea And Vomiting  . Lyrica [Pregabalin] Other    Uncontrolled blood sugar     Home Medications   ACCU-CHEK AVIVA PLUS TEST STRIP    Checking 4 times per day as instructed   ALBUTEROL  SULFATE HFA (PROVENTIL ,VENTOLIN ,PROAIR ) 108 (90 BASE) MCG/ACT INHALER    Inhale two puffs into the lungs every 4 (four)  hours as needed.   ALCOHOL SWABS (DROPSAFE ALCOHOL PREP) 70 % PADS    Apply topically.   ASPIRIN (ECOTRIN LOW DOSE) EC TABLET    Take one tablet (81 mg dose) by mouth daily.   ATORVASTATIN (LIPITOR) 40 MG TABLET    Take one tablet (40 mg dose) by mouth daily.   BLOOD GLUCOSE CALIBRATION (TRUE METRIX LEVEL 1) LOW SOLN       BLOOD GLUCOSE MONITORING SUPPL (TRUE METRIX AIR GLUCOSE METER) W/DEVICE KIT       DILTIAZEM HCL (CARDIZEM CD) 120 MG 24 HR CAPSULE    Take one capsule (120 mg dose) by mouth daily.   FUROSEMIDE (LASIX) 40 MG TABLET    Take one tablet (40 mg dose) by mouth daily.   INSULIN NPH HUMAN, ISOPHANE, (NOVOLIN N [place])    Inject into the skin. 30 units in the am and 15 in the evening   INSULIN REGULAR (HUMULIN R,NOVOLIN R) 100 UNITS/ML INJECTION    Inject into the skin 30 (thirty) minutes before meals. Takes 2 to 4 units - if FSBS is >180   INSULIN SYRINGE-NEEDLE U-100 (TRUEPLUS INSULIN SYRINGE) 31G X 5/16 0.3 ML MISC    1 injection by Does not apply route 2 (two) times daily. Use one syringe to inject insulin 2 times daily   LEVOTHYROXINE SODIUM (SYNTHROID,LEVOTHROID,LOVOXYL) 50 MCG TABLET    Take one tablet (50 mcg dose) by mouth daily.   LOSARTAN POTASSIUM (COZAAR) 25 MG TABLET    Take one tablet (25 mg dose) by mouth 2 (two) times daily.   METFORMIN (GLUCOPHAGE) 1000 MG TABLET    Take 1 tablet twice daily with meals   METOPROLOL TARTRATE (LOPRESSOR) 100 MG TABLET    Take one tablet (100 mg dose) by mouth 2 (two) times daily.   MULTIPLE VITAMIN (MULTIVITAMIN) CAPSULE    Take one capsule by mouth daily.   ONDANSETRON  (ZOFRAN -ODT) 4 MG DISINTEGRATING TABLET    Take one tablet (4 mg dose) by mouth every 8 (eight) hours as needed.   POTASSIUM CHLORIDE (K-DUR,KLOR-CON) 20 MEQ CR TABLET    Take one tablet (20 mEq dose) by mouth daily.   PREDNISONE  (DELTASONE ) 20 MG TABLET    Take one half tablet (10 mg dose) by mouth every morning.   RIVAROXABAN (XARELTO) 20 MG TABS TABLET    Take one tablet  (20 mg dose) by mouth daily.   TRUEPLUS LANCETS 33G MISC    Apply 1 each topically 3 (three) times a day.    Primary Survey  Primary Survey  Review of Systems   Review of Systems  All other systems reviewed and are negative. Except as per stated in HPI  Physical Exam   ED Triage Vitals [06/23/23 2226]  BP (!) 155/73  Heart Rate 95  Resp 24  SpO2 94 %  Temp 98.9 F (37.2 C)    Physical Exam  Nursing note and vitals reviewed. Constitutional: He appears well-nourished. He appears distressed (From coughing).  Obese white male in mild distress from coughing  HENT:  Head: Normocephalic and atraumatic.  Eyes: EOM are intact. Conjunctivae are normal. Pupils are equal, round, and reactive to light.  Neck: Normal range of motion. Neck supple.  Cardiovascular: Normal rate, regular rhythm and normal heart sounds.  No audible murmur. No friction rub and gallop.  Pulmonary/Chest: Tachypneic. Respiratory effort normal and breath sounds normal. Chest wall tenderness.  Abdominal: Soft. There is no abdominal tenderness. Abdomen not distended. Bowel sounds are normal.  Musculoskeletal: Decreased range of motion.     Cervical back: Normal range of motion and neck supple. no edema.  Neurological: He is alert. Moves all extremities equally. He has normal speech.  Skin: Skin is warm. Skin is dry.  Psychiatric: He has a normal mood and affect. His behavior is normal. Thought content normal.     ED Course   Lab results:   GEN5 CARDIAC TROPONIN T (TNT5) BASELINE - Abnormal      Result Value   TnT-Gen5 (0hr) 22 (*)    Comment: An elevated Troponin indicates myocardial damage. Elevated troponin may also be due to pulmonary emboli, aortic dissection, heart failure, trauma, toxins and ischemia in the setting of critical illness.   CBC AND DIFFERENTIAL - Abnormal   WBC 11.5 (*)    RBC 4.71     HGB 14.2     HCT 42.6     MCV 90.4     MCH 30.1     MCHC 33.3     Plt Ct 264     RDW SD 42.6      MPV 9.0 (*)    NRBC% 0.0     Absolute NRBC Count 0.00     NEUTROPHIL % 86.2     LYMPHOCYTE % 7.2     MONOCYTE % 5.9     Eosinophil % 0.1     BASOPHIL % 0.3     IG% 0.3     ABSOLUTE NEUTROPHIL COUNT 9.93 (*)    ABSOLUTE LYMPHOCYTE COUNT 0.83 (*)    Absolute Monocyte Count 0.68     Absolute Eosinophil Count 0.01     Absolute Basophil Count 0.03     Absolute Immature Granulocyte Count 0.04 (*)   COMPREHENSIVE METABOLIC PANEL - Abnormal   Na 136     Potassium 4.0     Cl 98     CO2 25     AGAP 13     Glucose 173 (*)    BUN 19     Creatinine 1.05     Ca 9.2     ALK PHOS 90     T Bili 0.6     Total Protein 6.7     Alb 3.6     GLOBULIN 3.1     ALBUMIN/GLOBULIN RATIO 1.2     BUN/CREAT RATIO 18.1     ALT 17     AST 15     eGFR 79     Comment: Normal GFR (glomerular filtration rate) > 60 mL/min/1.73 meters squared, < 60 may include impaired kidney function. Calculation based on the Chronic Kidney Disease Epidemiology Collaboration (CK-EPI)equation refit without adjustment for race.  GEN5 CARDIAC TROPONIN T(TNT5)  1 HOUR - Abnormal   TnT-Gen5 (1hr) 26 (*)    Comment: An elevated Troponin indicates myocardial damage. Elevated troponin may also be due to pulmonary emboli, aortic dissection, heart failure, trauma, toxins and ischemia in the setting of critical illness.   Delta 1 Hour 4    COVID-19, FLU A+B AND RSV - Normal   Flu A Negative     Flu B Negative     RSV PCR Negative     SARS-COV-2 Not Detected     Narrative:    SARS-COV-2 (COVID-19)PCR-Negative results do not preclude SARS-CoV-2 infection and should not be used as the sole basis for patient management decisions. Negative results must be combined with clinical observations, patient history, and epidemiological information.  Flu and/or RSV - Negative results do not preclude the presence of Flu or RSV virus and should not be used as the sole basis for treatment or other patient management decisions. False negative  results may occur if virus is present at levels below the analytical limit of detection.  This test detects Influenza A, Influenza B, and Respiratory Syncytial Virus and SARS-COV-2 (COVID-19) by PCR.    Testing was performed using the CEPHEID SARS-CoV-2 EUA ASSAY:  The Cepheid SARS-CoV-2 EUA assay has not been FDA cleared or approved.  It has been authorized by FDA under an Emergency Use Authorization (EUA).  The test has been authorized only for the detection of nucleic acid from SARS-CoV-2, not for any other viruses or pathogens.  It is only authorized for the duration of time the declaration that circumstances exist justifying the authorization of the emergency use of in vitro diagnostic tests for detection of SARS-CoV-2 virus and/or diagnosis of COVID-19 infection under section 564(b) (1) of the Act, 21 U.S.C 360bbb-3 (b) (1), unless the authorization is terminated or revoked sooner.   Link to Patient Fact Sheet:  ElectronicsManager.it   Link to Provider Fact Sheet  SeeTennis.com.ee    MAGNESIUM - Normal   Mg 1.8    NT-PROBNP - Normal   NT-ProBNP 264     Comment: Among patients with dyspnea, NT-proBNPis highly sensitive for the detection of acute congestive heart failure.  In addition, a NT-proBNP <300 pg/mL effectively rules out acute congestive heart failure, with 98% negative predictive value.   GEN5 CARDIAC TROPONIN T(TNT5) 3 HOUR  LIGHT BLUE TOP  GOLD SST    Imaging:   XR CHEST PA AND LATERAL   Narrative:    TECHNIQUE: XR CHEST PA AND LATERAL  INDICATION: Chest Pain  COMPARISON: 04/06/2023.  FINDINGS:  The patient is status post sternotomy.  There is mild increased density at the left lung base and over the lower thoracic spine on the lateral view which may represent a developing left lower lobe infiltrate.     There is no pleural effusion.  The cardiac silhouette is unremarkable.  The  visualized osseous structures are unremarkable.    Impression:    IMPRESSION:  Increased density noted in the left lower lobe which may represent developing left lower lobe infiltrate.  Electronically Signed by: Marinda Fleming, MD on 06/23/2023 11:23 PM  [facility]   Narrative:    TECHNIQUE: Axial images through the chest were obtained following IV administration of 100cc Isovue 370. Image postprocessing was then performed creating multiplanar 2-D and angiographic 3-D MIP, shaded surface rendering or 3-D volume rendering images for review and comparison. Dose reduction was utilized (automated exposure control, mA or kV adjustment based on patient size, or iterative image reconstruction).  COMPARISON: 03/23/2023  INDICATION: Other Chest pain, cough, shortness of breath, hemoptysis   FINDINGS: . Pulmonary Arteries/Aorta: There is adequate contrast opacification of the pulmonary arteries.  No intravascular filling defect(s) seen to indicate pulmonary emboli.  There is no thoracic aortic aneurysm or dissection seen.  . Mediastinum/Hila: There is no abnormal mediastinal lymphadenopathy. There is no acute abnormality involving heart or pericardium.  There are coronary artery calcifications.  . Lungs/Pleura: There is left lower lobe infiltrate which is concerning for pneumonia. There is a small amount of groundglass opacity in the lingula. There is minimal groundglass opacity at the right lung base. There is no pleural effusion or pneumothorax seen.  . Bones/Soft Tissues: No acute abnormality.  SABRA Upper Abdomen: No acute abnormality seen in visualized upper abdomen.       Impression:    IMPRESSION: Left lower lobe infiltrate is concerning for pneumonia. There is some additional nonspecific groundglass opacity in the lingula and minimally in the right lower lobe.  There is no evidence for pulmonary embolism.  Coronary artery calcification.    Electronically Signed by: Sonny Livings, MD on 06/24/2023 1:29 AM      ECG: ECG Results          ECG 12 lead (In process)  Result time 06/24/23 00:38:59    In process             Narrative:   Diagnosis Class Abnormal Acquisition Device D3K Systolic BP 128 Diastolic BP 81 Ventricular Rate 70 Atrial Rate 70 P-R Interval 182 QRS Duration 128 Q-T Interval 390 QTC Calculation(Bazett) 421 Calculated P Axis 50 Calculated R Axis 24 Calculated T Axis 60  Diagnosis Sinus rhythm with premature atrial complexes in a pattern of bigeminy Right bundle branch block Possible Inferior infarct (cited on or before 06-Apr-2023) Abnormal ECG When compared with ECG of 23-Jun-2023 22:40, premature atrial complexes are now present                         ECG 12 lead (In process)  Result time 06/23/23 22:41:15    In process             Narrative:   Diagnosis Class Abnormal Acquisition Device D3K Systolic BP 128 Diastolic BP 81 Ventricular Rate 88 Atrial Rate 88 P-R Interval 178 QRS Duration 124 Q-T Interval 370 QTC Calculation(Bazett) 447 Calculated P Axis 51 Calculated R Axis 34 Calculated T Axis 51  Diagnosis Normal sinus rhythm Right bundle branch block Cannot rule out Inferior infarct (cited on or before 06-Apr-2023) Abnormal ECG When compared with ECG of 06-Apr-2023 18:26, No significant change was found                      HEAR Score History: Mix of high & low features ECG: Non specific repolarisation disturbance Age: 40-64 yrs Risk Factors: 1 or 2 risk factors HEAR Score Total: 4                                                        Pre-Sedation Procedures    Medical Decision Making Patient does feel better after Hycodan cough syrup which stopped his coughing and he had no further episodes of hemoptysis.  Labs unremarkable including serial troponins.  His viral markers were negative.  Chest x-ray  showed questionable left lower lobe  infiltrate.  EKG showed sinus rhythm with without ischemia or ectopy.  CT a pulmonary was negative for PE but did show left lower lobe pneumonia.  Patient was treated with IV Rocephin and doxycycline and discharged on doxycycline as well as a short course of Hycodan cough syrup.  He was advised to follow-up with his primary care physician and return if any worsening symptoms.  Amount and/or Complexity of Data Reviewed Labs: ordered. Radiology: ordered. ECG/medicine tests: ordered.  Risk Prescription drug management.           Provider Communication  New Prescriptions   DOXYCYCLINE HYCLATE (VIBRA-TABS) 100 MG TABLET    Take one tablet (100 mg dose) by mouth 2 (two) times daily for 10 days.      Quantity: 20 tablet    Refills: 0   HYDROCODONE -HOMATROPINE (HYCODAN,HYDROMET) 5-1.5 MG/5 ML SOLUTION    Take 5 mLs by mouth every 8 (eight) hours as needed for up to 3 days. Max Daily Amount: 15 mLs      Quantity: 60 mL    Refills: 0    Modified Medications   No medications on file    Discontinued Medications   No medications on file    Clinical Impression Final diagnoses:  Pneumonia of left lower lobe due to infectious organism  Chest pain, unspecified type    ED Disposition     ED Disposition  Discharge   Condition  Stable   Comment  --                   Electronically signed by:    Glendia CHRISTELLA Jude, MD 06/24/23 [phone]

## 2023-10-05 NOTE — ED Provider Notes (Signed)
 [facility]  ED Provider Note  Nathan Durham. 63 y.o. male DOB: 11-23-1960 MRN: [id number] History   Chief Complaint  Patient presents with  . Chest Pain    X 12 years; worsening over last 2-3 days  . Leg Pain    X 12 years; worsening over last 2-3 days; swelling noted to bilateral lower legs; states his feet don't normally swell    Chest Pain Associated symptoms: shortness of breath   Associated symptoms: no cough, no dizziness, no fever and no weakness   Leg Pain Associated symptoms: no fever    Patient is a 63 year old male with past medical history of asthma, COPD, CAD, polymyalgia rheumatica, and kidney stones presenting to the ED for shortness of breath and worsening edema.  This morning the patient noticed increased leg edema and last night had some shortness of breath with laying down. patient has had 12 years of chest pain and leg pain.  Patient stated that he has chronic peripheral edema.  Patient denied history of CHF.  He does take Lasix for fluid retention.  Patient denies any current chest pain or shortness of breath.  He has also had some chronic back pain.  Patient denies any CKD.    Past Medical History:  Diagnosis Date  . Asthma (*)   . CAD (coronary artery disease)   . Carpal tunnel syndrome, bilateral   . Chronic back pain   . Chronic bilateral low back pain without sciatica   . Chronic pain syndrome   . COPD (chronic obstructive pulmonary disease) (*)   . Crohn's disease (*)    no sign at last 2 scopes  . DDD (degenerative disc disease), cervical   . Diabetes mellitus (*)    IDDM  . Disease of thyroid gland   . Dupuytren's contracture of right hand   . Fibromyalgia   . GERD (gastroesophageal reflux disease)   . Gout   . Hypertension   . IBD (inflammatory bowel disease)   . IDA (iron deficiency anemia)   . Kidney stones   . Muscle pain    pt stated chronic pain  . Neuropathy   . OSA (obstructive sleep  apnea)    no CPAP - cost  . Paroxysmal atrial fibrillation (*)   . Polymyalgia rheumatica (*)   . Polymyositis (*) 02/03/2014  . RSV (respiratory syncytial virus infection) 03/25/2022  . 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 Vitamin D deficiency     Past Surgical History:  Procedure Laterality Date  . Cardiac catheterization  08/14/2015   3 vessel CAD  . Colonoscopy  2015   [facility]   . Coronary artery bypass graft  09/04/2015   CORONARY ARTERY BYPASS GRAFT X3 WITH MAZE, PULMONARY VEIN ISOLATION AND LEFT ATRIAL APPENDAGE LIGATION - Dr. Joline  . Cystoscopy w/ ureteral stent placement  03/26/2022  . Cystoscopy w/ ureteroscopy w/ lithotripsy     x2  . Endoscopy    . Endoscopy wrist surg; w/releas transver ligamt Right 10/21/2016  . Extracorporeal shock wave lithotripsy Left 03/19/2018  . Fasciect part palm w/rel 1 digit; ea add digit Right 10/21/2016  . Fasciect part palmar w/rel 1 digt w/wo z-plasty Right 10/21/2016  . Open treatment of femoral fracture, proximal end, neck, internal fixation or prosthetic replacement Left 07/24/2016  . Right endoscopic carpal tunnel release, ulnar nerve transposition, dupyten's release palmer, ring, long and fifth finger Right 10/21/2016  . Upper gastrointestinal endoscopy  2015   [facility]  Medical    Social History   Substance and Sexual Activity  Alcohol Use Yes   Comment: occasionally when I have kidney stones   Tobacco Use History[1] E-Cigarettes  . Vaping Use Never User   . Start Date    . Cartridges/Day    . Quit Date     Social History   Substance and Sexual Activity  Drug Use Never   Tetanus up to date?: Unknown Immunizations Up to Date?: Unknown   Allergies[2]  Discharge Medication List as of 10/05/2023 11:13 AM     CONTINUE these medications which have NOT CHANGED   Details  ACCU-CHEK AVIVA PLUS test strip Checking 4 times per day as instructed, Normal    albuterol  sulfate HFA (PROVENTIL ,VENTOLIN ,PROAIR ) 108 (90 Base)  MCG/ACT inhaler Inhale two puffs into the lungs every 4 (four) hours as needed., Historical Med    Alcohol Swabs (DROPSAFE ALCOHOL PREP) 70 % PADS Apply topically., Starting Fri 03/28/2022, Historical Med    aspirin (ECOTRIN LOW DOSE) EC tablet Take one tablet (81 mg dose) by mouth daily., Starting Fri 03/20/2023, Historical Med    atorvastatin (LIPITOR) 40 mg tablet Take one tablet (40 mg dose) by mouth daily., Starting Fri 03/28/2022, Historical Med    Blood Glucose Calibration (TRUE METRIX LEVEL 1) Low SOLN Historical Med    Blood Glucose Monitoring Suppl (TRUE METRIX AIR GLUCOSE METER) w/Device KIT Historical Med    diltiazem HCl (CARDIZEM CD) 120 mg 24 hr capsule Take one capsule (120 mg dose) by mouth daily., Starting Fri 08/14/2023, Normal    furosemide (LASIX) 40 mg tablet Take one tablet (40 mg dose) by mouth daily., Starting Thu 02/05/2023, Historical Med    Insulin NPH Human, Isophane, (NOVOLIN N [place]) Inject into the skin. 30 units in the am and 15 in the evening, Historical Med    insulin regular (HUMULIN R,NOVOLIN R) 100 units/mL injection Inject into the skin 30 (thirty) minutes before meals. Takes 2 to 4 units - if FSBS is >180, Historical Med    Insulin Syringe-Needle U-100 (TRUEPLUS INSULIN SYRINGE) 31G X 5/16 0.3 ML MISC 1 injection by Does not apply route 2 (two) times daily. Use one syringe to inject insulin 2 times daily, Starting Fri 07/15/2019, Normal    levothyroxine sodium (SYNTHROID,LEVOTHROID,LOVOXYL) 50 mcg tablet Take one tablet (50 mcg dose) by mouth daily., Historical Med    losartan potassium (COZAAR) 25 mg tablet Take one tablet (25 mg dose) by mouth 2 (two) times daily., Starting Fri 08/14/2023, Normal    metformin (GLUCOPHAGE) 1000 MG tablet Take 1 tablet twice daily with meals, Normal    metoprolol tartrate (LOPRESSOR) 50 mg tablet Take one tablet (50 mg dose) by mouth 2 (two) times daily., Starting Fri 08/14/2023, Normal    Multiple Vitamin (MULTIVITAMIN)  capsule Take one capsule by mouth daily., Historical Med    ondansetron  (ZOFRAN -ODT) 4 mg disintegrating tablet Take one tablet (4 mg dose) by mouth every 8 (eight) hours as needed., Starting Tue 09/30/2022, Normal    potassium chloride (K-DUR,KLOR-CON) 20 mEq CR tablet Take one tablet (20 mEq dose) by mouth daily., Starting Mon 03/09/2023, Historical Med    predniSONE  (DELTASONE ) 20 mg tablet Take one half tablet (10 mg dose) by mouth every morning., Starting Fri 03/28/2022, Historical Med    rivaroxaban (XARELTO) 20 mg TABS tablet Take one tablet (20 mg dose) by mouth daily., Starting Thu 04/02/2023, Until Fri 04/01/2024, Normal    TRUEPLUS LANCETS 33G MISC Apply 1 each topically 3 (three)  times a day., Starting Fri 03/28/2022, Historical Med        Primary Survey  Primary Survey  Review of Systems   Review of Systems  Constitutional:  Negative for chills and fever.  Respiratory:  Positive for shortness of breath. Negative for cough, choking and chest tightness.        Patient denies any current shortness of breath but endorsed orthopnea last night.  Cardiovascular:  Positive for chest pain.       Patient describes chronic chest pain as dull and achy.  He states that sometimes the pain is worse with exertion.  Neurological:  Negative for dizziness, facial asymmetry, speech difficulty, weakness and light-headedness.    Physical Exam   ED Triage Vitals [10/05/23 0710]  BP (!) 169/69  Heart Rate 75  Resp 20  SpO2 95 %  Temp 98.2 F (36.8 C)    Physical Exam  Constitutional: He appears well-developed and well-nourished. He no respiratory distress.  Well-appearing male. no acute distress, appears stated age.  Cardiovascular: Normal rate and normal heart sounds.  Patient has bilateral pitting peripheral lower extremity edema.  His right leg appears to be more swollen than the left.  Pulmonary/Chest: No respiratory distress. Good air movement. Not tachypneic. Respiratory effort  normal and breath sounds normal. No chest wall tenderness.     ED Course   Lab results:   GEN5 CARDIAC TROPONIN T (TNT5) BASELINE - Abnormal      Result Value   TnT-Gen5 (0hr) 32 (*)    Comment: An elevated Troponin indicates myocardial damage. Elevated troponin may also be due to pulmonary emboli, aortic dissection, heart failure, trauma, toxins and ischemia in the setting of critical illness.   CBC AND DIFFERENTIAL - Abnormal   WBC 8.3     RBC 4.62 (*)    HGB 14.1     HCT 41.9     MCV 90.7     MCH 30.5     MCHC 33.7     Plt Ct 219     RDW SD 46.0     MPV 9.0 (*)    NRBC% 0.0     Absolute NRBC Count 0.00     NEUTROPHIL % 73.3     LYMPHOCYTE % 15.5     MONOCYTE % 9.4     Eosinophil % 0.7     BASOPHIL % 0.6     IG% 0.5     ABSOLUTE NEUTROPHIL COUNT 6.10     ABSOLUTE LYMPHOCYTE COUNT 1.29     Absolute Monocyte Count 0.78     Absolute Eosinophil Count 0.06     Absolute Basophil Count 0.05     Absolute Immature Granulocyte Count 0.04 (*)   COMPREHENSIVE METABOLIC PANEL - Abnormal   Na 137     Potassium 3.8     Cl 99     CO2 25     AGAP 13     Glucose 174 (*)    BUN 16     Creatinine 0.87     Ca 9.3     ALK PHOS 80     T Bili 0.6     Total Protein 6.6     Alb 3.7     GLOBULIN 2.9     ALBUMIN/GLOBULIN RATIO 1.3     BUN/CREAT RATIO 18.4     ALT 18     AST 18     eGFR 96     Comment: Normal GFR (glomerular filtration rate) > 60  mL/min/1.73 meters squared, < 60 may include impaired kidney function. Calculation based on the Chronic Kidney Disease Epidemiology Collaboration (CK-EPI)equation refit without adjustment for race.  GEN5 CARDIAC TROPONIN T(TNT5) 1 HOUR - Abnormal   TnT-Gen5 (1hr) 31 (*)    Comment: An elevated Troponin indicates myocardial damage. Elevated troponin may also be due to pulmonary emboli, aortic dissection, heart failure, trauma, toxins and ischemia in the setting of critical illness.   Delta 1 Hour -1    GEN5 CARDIAC TROPONIN T(TNT5) 3 HOUR  - Abnormal   TnT-Gen5 (3hr) 28 (*)    Comment: An elevated Troponin indicates myocardial damage. Elevated troponin may also be due to pulmonary emboli, aortic dissection, heart failure, trauma, toxins and ischemia in the setting of critical illness.   Delta 3 Hour -4    MAGNESIUM - Normal   Mg 1.9    NT-PROBNP - Normal   NT-ProBNP 175     Comment: Among patients with dyspnea, NT-proBNPis highly sensitive for the detection of acute congestive heart failure.  In addition, a NT-proBNP <300 pg/mL effectively rules out acute congestive heart failure, with 98% negative predictive value.   LIGHT BLUE TOP  GOLD SST    Imaging:   XR CHEST PA AND LATERAL   Narrative:    History:  Chest Pain  Technique: 2 view chest  Comparison: 06/23/2023  Interpretation: The lungs are clear. There is no pneumothorax or pleural effusion. The heart size is stable status post CABG       Impression:    Impression: No acute findings.  Electronically Signed by: Lynwood Portugal on 10/05/2023 7:38 AM  US  VENOUS LOWER EXTREMITY BILATERAL   Narrative:    HISTORY:  Edema  COMPARISON:  None  TECHNIQUE: The veins of both lower extremities were interrogated from the visible common femoral vein to the distal popliteal vein. The junction of the greater saphenous with the common femoral vein as well as the posterior tibial veins were evaluated. Gray scale, color, spectral, and doppler sonography was utilized and analyzed.  FINDINGS: There is normal flow, compressibility, and augmentation of the deep venous systems.    Impression:    IMPRESSION:  No evidence of DVT.  Electronically Signed by: Lynwood Portugal on 10/05/2023 9:44 AM  CT ANGIO CHEST PULMONARY   Narrative:    INDICATION: Shortness of breath.  COMPARISON:  June 24, 2023.  TECHNIQUE:  CT ANGIO CHEST PULMONARY was performed during IV administration of contrast. Contrast: 98 mL IOPAMIDOL 76 % IV SOLN   Image post processing was performed creating multi  planar 2D and angiographic 3D MIP, shaded surface rendering or 3D volume rendering images for review and comparison. Radiation dose reduction was utilized (automated exposure control, mA or kV adjustment based on patient size, or iterative image reconstruction).  Contrast bolus quality:  satisfactory  FINDINGS:  SUPPORT APPARATUS: N.A.   PULMONARY ARTERIES: No pulmonary emboli are identified. RV/LV ratio: <0.9 Interventricular septum: No bowing IVC/hepatic vein reflux: None  LUNGS/PLEURA: No focal airspace opacities/consolidations. No pneumothorax.  No abnormal pulmonary masses.  No pleural effusions.  HEART/MEDIASTINUM: Cardiac size is normal.  Calcification of the coronary arteries. No acute thoracic aortic abnormalities.  No hilar, mediastinal, or axillary lymphadenopathy.  MUSCULOSKELETAL: No acute or destructive osseous processes.  MISC: Atherosclerotic changes of the visualized aorta are present.   Chronic/degenerative changes of the spine are seen.     Impression:    IMPRESSION: 1.  No pulmonary embolism. 2.  No acute findings.  Electronically Signed by: Dallas Jubilee, MD on 10/05/2023 10:40 AM      ECG: ECG Results          ECG 12 lead (In process)  Result time 10/05/23 11:56:40    In process             Narrative:   Diagnosis Class Abnormal Acquisition Device D3K Systolic BP 160 Diastolic BP 70 Ventricular Rate 65 Atrial Rate 65 P-R Interval 178 QRS Duration 130 Q-T Interval 440 QTC Calculation(Bazett) 457 Calculated P Axis 40 Calculated R Axis -8 Calculated T Axis 54  Diagnosis ### Poor data quality, interpretation may be adversely affected Normal sinus rhythm Right bundle branch block Inferior infarct (cited on or before 06-Apr-2023) Abnormal ECG When compared with ECG of 05-Oct-2023 08:29, No significant change was found                         ECG 12 lead (In process)  Result time 10/05/23 09:10:39     In process             Narrative:   Diagnosis Class Abnormal Acquisition Device D3K Systolic BP 157 Diastolic BP 71 Ventricular Rate 68 Atrial Rate 68 P-R Interval 176 QRS Duration 128 Q-T Interval 434 QTC Calculation(Bazett) 461 Calculated P Axis 30 Calculated R Axis -43 Calculated T Axis 57  Diagnosis Normal sinus rhythm with sinus arrhythmia Left axis deviation Right bundle branch block Inferior infarct (cited on or before 06-Apr-2023) Abnormal ECG When compared with ECG of 05-Oct-2023 07:04, QRS axis shifted left                         ECG 12 lead (In process)  Result time 10/05/23 07:31:31    In process             Narrative:   Diagnosis Class Abnormal Acquisition Device MV360 Ventricular Rate 73 Atrial Rate 73 P-R Interval 172 QRS Duration 130 Q-T Interval 420 QTC Calculation(Bazett) 462 Calculated P Axis 40 Calculated R Axis -3 Calculated T Axis 62  Diagnosis Normal sinus rhythm Right bundle branch block Possible Inferior infarct (cited on or before 06-Apr-2023) Abnormal ECG When compared with ECG of 24-Jun-2023 00:34, No significant change was found                            HEAR Score History: Mostly high risk features ECG: Normal Age: 74-64 yrs Risk Factors: More than or equal 3 risk factors or history of atherosclerotic disease HEAR Score Total: 5                                                        Pre-Sedation Procedures    Medical Decision Making Patient's lower extremity edema and shortness of breath placed PE high on the differential.  This was ruled out through CTA of the chest which had no acute findings.  Patient also received a venous duplex ultrasound which showed no DVT.  I got a BNP to evaluate for CHF which also came back negative.  Using the [facility] algorithm his troponins and hear score supported HPI cardiology follow-up within 72 hours.  Patient was stable, and  asymptomatic supporting a safe discharge and prompt follow-up with both cardiology  and his PCP.  I gave the patient strict return to ED protocol.  Amount and/or Complexity of Data Reviewed Labs: ordered. Radiology: ordered.  Risk Prescription drug management.            Provider Communication  Discharge Medication List as of 10/05/2023 11:13 AM      Discharge Medication List as of 10/05/2023 11:13 AM      Discharge Medication List as of 10/05/2023 11:13 AM      Clinical Impression Final diagnoses:  Chest pain, unspecified type  Shortness of breath  Elevated troponin    ED Disposition     ED Disposition  Discharge   Condition  Stable   Comment  --                 Follow-up Information     Ambulatory Referral to [facility] for ED 72 Hr Chest Pain F/U.   Comments: Chest pain, ACS ruled out in ED        Patria CHRISTELLA Grit, MD.   Specialties: Internal Medicine, Infectious Diseases Contact information: [address] [place] [address] [phone]                  Electronically signed by:       [1] Social History Tobacco Use  Smoking Status Never  . Passive exposure: Never  Smokeless Tobacco Never  [2] Allergies Allergen Reactions  . Celebrex [Celecoxib] Palpitations and Other    palpitations Chest tight-   . Hydromorphone  Hcl Hypotension  . Cymbalta [Duloxetine Hcl] Nausea And Vomiting  . Duloxetine Nausea And Vomiting  . Lyrica [Pregabalin] Other    Uncontrolled blood sugar    Norman Osgood, PA-C 10/05/23 1338

## 2023-12-01 NOTE — ED Provider Notes (Signed)
 Chief Complaint  Patient presents with  . Back Pain    Intermittent Mid back pain x 3-4 days - no known injury     Nathan Durham. is a 63 y.o. male with multiple comorbidities including but not limited to diabetes, CAD, chronic back pain, hypertension, polymyalgia rheumatica, polymyositis who presents with mid to lower back pain x 4 days.  Patient denies any direct injuries to the region.  Patient denies any groin paresthesia or bowel/bladder incontinence.  He denies any rash, fevers, chills, nausea, vomiting, shortness breath, chest pain, or abdominal pain.  The patient did not take anything prior to ED arrival.   Review of Systems  Constitutional:  Negative for chills and fever.  HENT:  Negative for ear pain and sore throat.   Eyes:  Negative for pain and visual disturbance.  Respiratory:  Negative for cough and shortness of breath.   Cardiovascular:  Negative for chest pain and palpitations.  Gastrointestinal:  Negative for abdominal pain, nausea and vomiting.  Genitourinary:  Negative for dysuria and hematuria.  Musculoskeletal:  Positive for back pain. Negative for arthralgias.  Skin:  Negative for color change and rash.  Neurological:  Negative for seizures and syncope.  All other systems reviewed and are negative.   Past Medical History:  Diagnosis Date  . Asthma (*)   . CAD (coronary artery disease)   . Carpal tunnel syndrome, bilateral   . Chronic back pain   . Chronic bilateral low back pain without sciatica   . Chronic pain syndrome   . COPD (chronic obstructive pulmonary disease) (*)   . Crohn's disease (*)    no sign at last 2 scopes  . DDD (degenerative disc disease), cervical   . Diabetes mellitus (*)    IDDM  . Disease of thyroid gland   . Dupuytren's contracture of right hand   . Fibromyalgia   . GERD (gastroesophageal reflux disease)   . Gout   . Hypertension   . IBD (inflammatory bowel disease)   . IDA (iron deficiency anemia)   . Kidney  stones   . Muscle pain    pt stated chronic pain  . Neuropathy   . OSA (obstructive sleep apnea)    no CPAP - cost  . Paroxysmal atrial fibrillation (*)   . Polymyalgia rheumatica (*)   . Polymyositis (*) 02/03/2014  . RSV (respiratory syncytial virus infection) 03/25/2022  . 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 Vitamin D deficiency    Past Surgical History:  Procedure Laterality Date  . Cardiac catheterization  08/14/2015   3 vessel CAD  . Colonoscopy  2015   [facility]   . Coronary artery bypass graft  09/04/2015   CORONARY ARTERY BYPASS GRAFT X3 WITH MAZE, PULMONARY VEIN ISOLATION AND LEFT ATRIAL APPENDAGE LIGATION - Dr. Joline  . Cystoscopy w/ ureteral stent placement  03/26/2022  . Cystoscopy w/ ureteroscopy w/ lithotripsy     x2  . Endoscopy    . Endoscopy wrist surg; w/releas transver ligamt Right 10/21/2016  . Extracorporeal shock wave lithotripsy Left 03/19/2018  . Fasciect part palm w/rel 1 digit; ea add digit Right 10/21/2016  . Fasciect part palmar w/rel 1 digt w/wo z-plasty Right 10/21/2016  . Open treatment of femoral fracture, proximal end, neck, internal fixation or prosthetic replacement Left 07/24/2016  . Right endoscopic carpal tunnel release, ulnar nerve transposition, dupyten's release palmer, ring, long and fifth finger Right 10/21/2016  . Upper gastrointestinal endoscopy  2015   [facility]  Medical   Family History  Problem Relation Age of Onset  . Diabetes Mother   . Diabetes Father   . Heart disease Father   . Kidney disease Father      Social History   Tobacco Use  . Smoking status: Never    Passive exposure: Never  . Smokeless tobacco: Never  Substance Use Topics  . Alcohol use: Yes    Comment: occasionally when I have kidney stones   Patient's Medications  New Prescriptions   DICLOFENAC SODIUM (VOLTAREN) 75 MG EC TABLET    Take one tablet (75 mg dose) by mouth 2 (two) times daily for 10 days.   LIDOCAINE  (LIDODERM ) 5%    Place one patch onto  the skin daily for 10 days. Remove & Discard patch within 12 hours or as directed by MD   METHOCARBAMOL (ROBAXIN) 500 MG TABLET    Take one tablet (500 mg dose) by mouth 2 (two) times a day as needed (muscle spasms/ache) for up to 10 days.  Previous Medications   ACCU-CHEK AVIVA PLUS TEST STRIP    Checking 4 times per day as instructed   ALBUTEROL  SULFATE HFA (PROVENTIL ,VENTOLIN ,PROAIR ) 108 (90 BASE) MCG/ACT INHALER    Inhale two puffs into the lungs every 4 (four) hours as needed.   ALCOHOL SWABS (DROPSAFE ALCOHOL PREP) 70 % PADS    Apply topically.   ASPIRIN (ECOTRIN LOW DOSE) EC TABLET    Take one tablet (81 mg dose) by mouth daily.   ATORVASTATIN (LIPITOR) 40 MG TABLET    Take one tablet (40 mg dose) by mouth daily.   BLOOD GLUCOSE CALIBRATION (TRUE METRIX LEVEL 1) LOW SOLN       BLOOD GLUCOSE MONITORING SUPPL (TRUE METRIX AIR GLUCOSE METER) W/DEVICE KIT       DILTIAZEM HCL (CARDIZEM CD) 120 MG 24 HR CAPSULE    Take one capsule (120 mg dose) by mouth daily.   FUROSEMIDE (LASIX) 40 MG TABLET    Take one tablet (40 mg dose) by mouth daily.   INSULIN NPH HUMAN, ISOPHANE, (NOVOLIN N [place])    Inject into the skin. 30 units in the am and 15 in the evening   INSULIN REGULAR (HUMULIN R,NOVOLIN R) 100 UNITS/ML INJECTION    Inject into the skin 30 (thirty) minutes before meals. Takes 2 to 4 units - if FSBS is >180   INSULIN SYRINGE-NEEDLE U-100 (TRUEPLUS INSULIN SYRINGE) 31G X 5/16 0.3 ML MISC    1 injection by Does not apply route 2 (two) times daily. Use one syringe to inject insulin 2 times daily   LEVOTHYROXINE SODIUM (SYNTHROID,LEVOTHROID,LOVOXYL) 50 MCG TABLET    Take one tablet (50 mcg dose) by mouth daily.   LOSARTAN POTASSIUM (COZAAR) 25 MG TABLET    Take one tablet (25 mg dose) by mouth 2 (two) times daily.   METFORMIN (GLUCOPHAGE) 1000 MG TABLET    Take 1 tablet twice daily with meals   METOPROLOL TARTRATE (LOPRESSOR) 50 MG TABLET    Take one tablet (50 mg dose) by mouth 2 (two) times daily.    MULTIPLE VITAMIN (MULTIVITAMIN) CAPSULE    Take one capsule by mouth daily.   ONDANSETRON  (ZOFRAN -ODT) 4 MG DISINTEGRATING TABLET    Take one tablet (4 mg dose) by mouth every 8 (eight) hours as needed.   POTASSIUM CHLORIDE (K-DUR,KLOR-CON) 20 MEQ CR TABLET    Take one tablet (20 mEq dose) by mouth daily.   PREDNISONE  (DELTASONE ) 20 MG TABLET    Take  one half tablet (10 mg dose) by mouth every morning.   RIVAROXABAN (XARELTO) 20 MG TABS TABLET    Take one tablet (20 mg dose) by mouth daily.   SPIRONOLACTONE (ALDACTONE) 25 MG TABLET    Take one half tablet (12.5 mg dose) by mouth daily.   TAMSULOSIN (FLOMAX) 0.4 MG CAPS    Take one capsule (0.4 mg dose) by mouth daily.   TRUEPLUS LANCETS 33G MISC    Apply 1 each topically 3 (three) times a day.  Modified Medications   No medications on file  Discontinued Medications   No medications on file   Allergies[1]  PHYSICAL EXAM: ED Triage Vitals  Encounter Vitals Group     BP 12/01/23 1421 (!) 123/55     Girls Systolic BP Percentile --      Girls Diastolic BP Percentile --      Boys Systolic BP Percentile --      Boys Diastolic BP Percentile --      Heart Rate 12/01/23 1421 68     Resp 12/01/23 1421 20     Temp 12/01/23 1421 97.4 F (36.3 C)     Temp src 12/01/23 1421 Oral     SpO2 12/01/23 1421 98 %     Weight 12/01/23 1420 106.1 kg (234 lb)     Height --      Head Circumference --      Peak Flow --      Pain Score 12/01/23 1420 Seven     Pain Loc --      Pain Education --      Exclude from Growth Chart --    Physical Exam Vitals and nursing note reviewed.  Constitutional:      General: He is not in acute distress.    Appearance: He is well-developed. He is obese. He is not ill-appearing or toxic-appearing.  HENT:     Head: Normocephalic and atraumatic.     Mouth/Throat:     Mouth: Mucous membranes are moist.     Pharynx: Oropharynx is clear.  Eyes:     Extraocular Movements: Extraocular movements intact.      Conjunctiva/sclera: Conjunctivae normal.     Pupils: Pupils are equal, round, and reactive to light.  Cardiovascular:     Rate and Rhythm: Normal rate and regular rhythm.     Pulses: Normal pulses.     Heart sounds: Normal heart sounds. No murmur heard. Pulmonary:     Effort: Pulmonary effort is normal. No respiratory distress.     Breath sounds: Normal breath sounds.  Abdominal:     General: Bowel sounds are normal. There is no distension.     Palpations: Abdomen is soft.     Tenderness: There is no abdominal tenderness.  Musculoskeletal:        General: No swelling.     Cervical back: Neck supple.     Comments: Mid lower back paraspinal tenderness to palpation.  No T or L-spine midline tenderness.  No step-off sign.  No ecchymosis, erythema, vesicular skin eruption, open wounds, bullae, crepitus, or superficial mass noted on back/flanks.  Skin:    General: Skin is warm and dry.     Capillary Refill: Capillary refill takes less than 2 seconds.  Neurological:     General: No focal deficit present.     Mental Status: He is alert and oriented to person, place, and time. Mental status is at baseline.     Comments: Ambulatory, moving all extremities  Psychiatric:  Mood and Affect: Mood normal.     DIAGNOSTICS:  Pulse Ox: Pulse oximetry 98% on RA indicating adequate oxygenation.  Imaging: XR Spine  Lumbar 2-3 Views  Final Result  Impression: Degenerative change as above.    Electronically Signed by: Lynwood Portugal on 12/01/2023 3:39 PM    XR Spine  Thoracic 2 Views  Final Result  Impression: Degenerative change without acute osseous finding.    Electronically Signed by: Lynwood Portugal on 12/01/2023 3:27 PM     Labs: Labs Reviewed  POCT GLUCOSE - Abnormal; Notable for the following components:      Result Value   Glucose, POC 182 (*)    All other components within normal limits    ED COURSE AND TREATMENT:  Patient's condition remained stable during Emergency  Department evaluation.   Previous medical records requested. 11/10/23 for cardiology visit  Orders written.  Diagnostics reviewed.   Medications  lidocaine  (LIDODERM ) patch 5% (1 patch Transdermal Patch Applied 12/01/23 1459)  methocarbamol (ROBAXIN) tablet 750 mg (750 mg Oral Given 12/01/23 1458)  ketoROLAC tromethamine (TORADOL) injection 30 mg (30 mg Intramuscular Given 12/01/23 1458)  acetaminophen  (TYLENOL ) tablet 1,000 mg (1,000 mg Oral Given 12/01/23 1458)    BP (!) 110/55   Pulse 68   Temp 97.6 F (36.4 C)   Resp 20   Wt 106.1 kg (234 lb)   SpO2 95%   BMI 33.58 kg/m   ED Course as of 12/01/23 1631  [facility] Documentation  Tue Dec 01, 2023  1443 Patient is a diabetic, and wants his glucose check. Will obtain POC glucose    CLINICAL IMPRESSION: 1. Back pain, unspecified back location, unspecified back pain laterality, unspecified chronicity       CONDITION ON DISCHARGE:  Stable  PLAN AND FOLLOW-UP:   The evaluation and treatment you received in the Emergency Department was rendered on an urgent or emergent basis only. Your evaluation and treatment today was not intended to be a substitute for, or an effort to provide complete continuing medical care. It is very important that you establish care or follow up with a primary care provider for new, remaining or continuing medical problems because it is not always possible to recognize and treat all elements of injuries or illnesses in a single visit. Please see your primary care provider in the next 1-2 days and arrange for other follow-up care as we discussed.    If you do not have a primary care provider, please follow up with:  [facility] or [facility]  Take the medications as prescribed for your symptoms. Do not take Robaxin, a muscle relaxer while driving or at work because it can make you drowsy. Follow up with your primary care within 1-2 weeks. Return to the ED  immediately without fail if your symptoms get worse, do not get better, or any other symptoms you feel need to be evaluated in the emergency department.  PRESCRIPTIONS:    Medication List     START taking these medications    diclofenac sodium 75 mg EC tablet Commonly known as: VOLTAREN Take one tablet (75 mg dose) by mouth 2 (two) times daily for 10 days.   lidocaine  5% Commonly known as: LIDODERM  Place one patch onto the skin daily for 10 days. Remove & Discard patch within 12 hours or as directed by MD   methocarbamol 500 mg tablet Commonly known as: ROBAXIN Take one tablet (500 mg dose) by mouth 2 (two)  times a day as needed (muscle spasms/ache) for up to 10 days.       ASK your doctor about these medications    ACCU-CHEK AVIVA PLUS test strip Generic drug: glucose blood Checking 4 times per day as instructed   albuterol  sulfate HFA 108 (90 Base) MCG/ACT inhaler Commonly known as: PROVENTIL ,VENTOLIN ,PROAIR    aspirin EC tablet Commonly known as: ECOTRIN LOW DOSE   atorvastatin 40 mg tablet Commonly known as: LIPITOR   diltiazem HCl 120 mg 24 hr capsule Commonly known as: CARDIZEM CD Take one capsule (120 mg dose) by mouth daily.   DROPSAFE ALCOHOL PREP 70 % Pads   furosemide 40 mg tablet Commonly known as: LASIX   insulin regular (HUMULIN R,NOVOLIN R) 100 units/mL injection   Insulin Syringe-Needle U-100 31G X 5/16 0.3 ML Misc Commonly known as: TRUEPLUS INSULIN SYRINGE 1 injection by Does not apply route 2 (two) times daily. Use one syringe to inject insulin 2 times daily   levothyroxine sodium 50 mcg tablet Commonly known as: SYNTHROID,LEVOTHROID,LOVOXYL   losartan potassium 25 mg tablet Commonly known as: COZAAR Take one tablet (25 mg dose) by mouth 2 (two) times daily.   metformin 1000 MG tablet Commonly known as: GLUCOPHAGE Take 1 tablet twice daily with meals   metoprolol tartrate 50 mg tablet Commonly known as: LOPRESSOR Take one tablet  (50 mg dose) by mouth 2 (two) times daily.   multivitamin capsule   NOVOLIN N [place]   ondansetron  4 mg disintegrating tablet Commonly known as: ZOFRAN -ODT Take one tablet (4 mg dose) by mouth every 8 (eight) hours as needed.   potassium chloride 20 mEq CR tablet Commonly known as: K-DUR,KLOR-CON   predniSONE  20 mg tablet Commonly known as: DELTASONE    spironolactone 25 mg tablet Commonly known as: ALDACTONE Take one half tablet (12.5 mg dose) by mouth daily.   tamsulosin 0.4 mg Caps Commonly known as: FLOMAX   TRUE METRIX AIR GLUCOSE METER w/Device Kit   TRUE METRIX LEVEL 1 Low Soln   TRUEPLUS LANCETS 33G Misc   XARELTO 20 MG Tabs tablet Generic drug: rivaroxaban Take one tablet (20 mg dose) by mouth daily.         Where to Get Your Medications     These medications were sent to [facility] - [place], [place] - [address]  [address] [place] [address]    Phone: [phone]  diclofenac sodium 75 mg EC tablet lidocaine  5% methocarbamol 500 mg tablet     MEDICAL DECISION MAKING:: MDM Number of Diagnoses or Management Options Back pain, unspecified back location, unspecified back pain laterality, unspecified chronicity Diagnosis management comments: A 63 year old male with multiple comorbidities presents to the ED with a chief complaint of mid lower back pain.  He is afebrile and hemodynamically stable.  I do not suspect cellulitis, herpes zoster, open wounds, or superficial abscess on clinical exam.  I also have lower suspicion for cauda equina, spinal epidural abscess, discitis, osteomyelitis, transverse myelitis, or acute significant disc herniation. X ray of thoracic and lumbar spine shows no acute fractures or dislocation on my review and per radiologist. Will provide symptomatic control to follow up with primary care as outpatient. At this time, there are no immediate life threatening conditions within the ED.  Strict return  precautions were provided to the patient who understands and agrees with the plan.  The patient is hemodynamically stable for discharge.    Amount and/or Complexity of Data Reviewed Tests in the radiology section  of CPT: ordered and reviewed Review and summarize past medical records: yes Independent visualization of images, tracings, or specimens: yes  Patient Progress Patient progress: stable    The patient was seen, evaluated and managed by ED attending physician, Ludie Primmer, D.O.       [1] Allergies Allergen Reactions  . Celebrex [Celecoxib] Palpitations and Other    palpitations Chest tight-   . Hydromorphone  Hcl Hypotension  . Cymbalta [Duloxetine Hcl] Nausea And Vomiting  . Duloxetine Nausea And Vomiting  . Lyrica [Pregabalin] Other    Uncontrolled blood sugar    Ludie Primmer, DO 12/01/23 1631

## 2023-12-05 NOTE — ED Provider Notes (Signed)
 ------------------------------------------------------------------------------- Attestation signed by Harlene LITTIE Sample, MD at 12/06/23 [phone] Patient seen and managed by APC.  We discussed the presentation and diagnosis..  I agree with treatment plan documented.  Face-to-face evaluation done by me -------------------------------------------------------------------------------   [facility]  ED Provider Note  Nathan Durham. 63 y.o. male DOB: 07/04/1960 MRN: [id number] History   Chief Complaint  Patient presents with  . Back Pain   Patient presents the ED today with back pain.  Patient states he was here few days ago diagnosed with a muscle strain in his left back.  Patient states his back has been bothering him for the past month.  Patient states it is bilateral but worse on the left side.  No radiation of pain.  Patient says the pain is intermittent but when it comes on strong causes him to cry.  Patient states there is no way this is a pulled muscle or muscle strain.  Patient states the doctor that saw him previously barely checked him out and prescribed him muscle relaxers and pain medications which has not touched it so he has not taken the medications.  Patient does have a history of chronic pain and previously saw a pain management doctor but stated that was BS because they only gave him 30 pain pills.  Patient states he has always had urinary incontinence and overactive bladder so is always having urinary symptoms but is unsure if it burns or stings when he pees.  Patient states he also has a history of kidney stones but this does not feel similar because of where the location is at.  Patient has no spinal tenderness.  No radiation down his legs.  No known injury.   Back Pain      Past Medical History:  Diagnosis Date  . Asthma (*)   . CAD (coronary artery disease)   . Carpal tunnel syndrome, bilateral   . Chronic back pain   . Chronic  bilateral low back pain without sciatica   . Chronic pain syndrome   . COPD (chronic obstructive pulmonary disease) (*)   . Crohn's disease (*)    no sign at last 2 scopes  . DDD (degenerative disc disease), cervical   . Diabetes mellitus (*)    IDDM  . Disease of thyroid gland   . Dupuytren's contracture of right hand   . Fibromyalgia   . GERD (gastroesophageal reflux disease)   . Gout   . Hypertension   . IBD (inflammatory bowel disease)   . IDA (iron deficiency anemia)   . Kidney stones   . Muscle pain    pt stated chronic pain  . Neuropathy   . OSA (obstructive sleep apnea)    no CPAP - cost  . Paroxysmal atrial fibrillation (*)   . Polymyalgia rheumatica (*)   . Polymyositis (*) 02/03/2014  . RSV (respiratory syncytial virus infection) 03/25/2022  . 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 Vitamin D deficiency     Past Surgical History:  Procedure Laterality Date  . Cardiac catheterization  08/14/2015   3 vessel CAD  . Colonoscopy  2015   [facility]   . Coronary artery bypass graft  09/04/2015   CORONARY ARTERY BYPASS GRAFT X3 WITH MAZE, PULMONARY VEIN ISOLATION AND LEFT ATRIAL APPENDAGE LIGATION - Dr. Joline  . Cystoscopy w/ ureteral stent placement  03/26/2022  . Cystoscopy w/ ureteroscopy w/ lithotripsy     x2  . Endoscopy    . Endoscopy  wrist surg; w/releas transver ligamt Right 10/21/2016  . Extracorporeal shock wave lithotripsy Left 03/19/2018  . Fasciect part palm w/rel 1 digit; ea add digit Right 10/21/2016  . Fasciect part palmar w/rel 1 digt w/wo z-plasty Right 10/21/2016  . Open treatment of femoral fracture, proximal end, neck, internal fixation or prosthetic replacement Left 07/24/2016  . Right endoscopic carpal tunnel release, ulnar nerve transposition, dupyten's release palmer, ring, long and fifth finger Right 10/21/2016  . Upper gastrointestinal endoscopy  2015   [facility]    Social History   Substance and Sexual Activity  Alcohol Use Yes    Comment: occasionally when I have kidney stones   Tobacco Use History[1] E-Cigarettes  . Vaping Use Never User   . Start Date    . Cartridges/Day    . Quit Date     Social History   Substance and Sexual Activity  Drug Use Never         Allergies[2]  Home Medications   ACCU-CHEK AVIVA PLUS TEST STRIP    Checking 4 times per day as instructed   ALBUTEROL  SULFATE HFA (PROVENTIL ,VENTOLIN ,PROAIR ) 108 (90 BASE) MCG/ACT INHALER    Inhale two puffs into the lungs every 4 (four) hours as needed.   ALCOHOL SWABS (DROPSAFE ALCOHOL PREP) 70 % PADS    Apply topically.   ASPIRIN (ECOTRIN LOW DOSE) EC TABLET    Take one tablet (81 mg dose) by mouth daily.   ATORVASTATIN (LIPITOR) 40 MG TABLET    Take one tablet (40 mg dose) by mouth daily.   BLOOD GLUCOSE CALIBRATION (TRUE METRIX LEVEL 1) LOW SOLN       BLOOD GLUCOSE MONITORING SUPPL (TRUE METRIX AIR GLUCOSE METER) W/DEVICE KIT       DICLOFENAC SODIUM (VOLTAREN) 75 MG EC TABLET    Take one tablet (75 mg dose) by mouth 2 (two) times daily for 10 days.   DILTIAZEM HCL (CARDIZEM CD) 120 MG 24 HR CAPSULE    Take one capsule (120 mg dose) by mouth daily.   FUROSEMIDE (LASIX) 40 MG TABLET    Take one tablet (40 mg dose) by mouth daily.   INSULIN NPH HUMAN, ISOPHANE, (NOVOLIN N [place])    Inject into the skin. 30 units in the am and 15 in the evening   INSULIN REGULAR (HUMULIN R,NOVOLIN R) 100 UNITS/ML INJECTION    Inject into the skin 30 (thirty) minutes before meals. Takes 2 to 4 units - if FSBS is >180   INSULIN SYRINGE-NEEDLE U-100 (TRUEPLUS INSULIN SYRINGE) 31G X 5/16 0.3 ML MISC    1 injection by Does not apply route 2 (two) times daily. Use one syringe to inject insulin 2 times daily   LEVOTHYROXINE SODIUM (SYNTHROID,LEVOTHROID,LOVOXYL) 50 MCG TABLET    Take one tablet (50 mcg dose) by mouth daily.   LIDOCAINE  (LIDODERM ) 5%    Place one patch onto the skin daily for 10 days. Remove & Discard patch within 12 hours or as directed by MD   LOSARTAN  POTASSIUM (COZAAR) 25 MG TABLET    Take one tablet (25 mg dose) by mouth 2 (two) times daily.   METFORMIN (GLUCOPHAGE) 1000 MG TABLET    Take 1 tablet twice daily with meals   METHOCARBAMOL (ROBAXIN) 500 MG TABLET    Take one tablet (500 mg dose) by mouth 2 (two) times a day as needed (muscle spasms/ache) for up to 10 days.   METOPROLOL TARTRATE (LOPRESSOR) 50 MG TABLET    Take one tablet (50  mg dose) by mouth 2 (two) times daily.   MULTIPLE VITAMIN (MULTIVITAMIN) CAPSULE    Take one capsule by mouth daily.   ONDANSETRON  (ZOFRAN -ODT) 4 MG DISINTEGRATING TABLET    Take one tablet (4 mg dose) by mouth every 8 (eight) hours as needed.   POTASSIUM CHLORIDE (K-DUR,KLOR-CON) 20 MEQ CR TABLET    Take one tablet (20 mEq dose) by mouth daily.   PREDNISONE  (DELTASONE ) 20 MG TABLET    Take one half tablet (10 mg dose) by mouth every morning.   RIVAROXABAN (XARELTO) 20 MG TABS TABLET    Take one tablet (20 mg dose) by mouth daily.   SPIRONOLACTONE (ALDACTONE) 25 MG TABLET    Take one half tablet (12.5 mg dose) by mouth daily.   TAMSULOSIN (FLOMAX) 0.4 MG CAPS    Take one capsule (0.4 mg dose) by mouth daily.   TRUEPLUS LANCETS 33G MISC    Apply 1 each topically 3 (three) times a day.    Primary Survey   Exposure     No visible trauma noted on back exam.      Review of Systems   Review of Systems  Musculoskeletal:  Positive for back pain.    Physical Exam   ED Triage Vitals [12/05/23 2232]  BP (!) 151/62  Heart Rate 74  Resp 16  SpO2 98 %  Temp 98 F (36.7 C)    Physical Exam  Nursing note and vitals reviewed. Constitutional: He appears well-developed and well-nourished.  HENT:  Head: Normocephalic and atraumatic.  Right Ear: Normal external ear.  Left Ear: Normal external ear.  Eyes: EOM are intact. Pupils are equal, round, and reactive to light.  Neck: Normal range of motion.  Cardiovascular: Normal rate.  Pulmonary/Chest: Respiratory effort normal.  Abdominal: Soft.   Musculoskeletal: Normal range of motion. No visible trauma noted on back exam.     Cervical back: Normal range of motion.       Back:     Comments: Pain over bilateral buttock worse on the left side.  Minimal tenderness to the touch.  No sciatica.   Neurological: He is alert and oriented to person, place, and time. Moves all extremities equally. Gait normal. He has normal speech.  Skin: Skin is warm. Skin is dry.  Psychiatric: He has a normal mood and affect.     ED Course   Lab results:   CBC AND DIFFERENTIAL - Abnormal      Result Value   WBC 14.3 (*)    RBC 4.70     HGB 14.2     HCT 41.6     MCV 88.5     MCH 30.2     MCHC 34.1     Plt Ct 284     RDW SD 44.7     MPV 9.0 (*)    NRBC% 0.0     Absolute NRBC Count 0.00     NEUTROPHIL % 78.2     LYMPHOCYTE % 11.6     MONOCYTE % 8.6     Eosinophil % 0.6     BASOPHIL % 0.2     IG% 0.8     ABSOLUTE NEUTROPHIL COUNT 11.13 (*)    ABSOLUTE LYMPHOCYTE COUNT 1.66     Absolute Monocyte Count 1.23 (*)    Absolute Eosinophil Count 0.09     Absolute Basophil Count 0.03     Absolute Immature Granulocyte Count 0.12 (*)   COMPREHENSIVE METABOLIC PANEL - Abnormal   Na 134 (*)  Potassium 5.0     Cl 98     CO2 21     AGAP 15     Glucose 107 (*)    BUN 30 (*)    Creatinine 1.11     Ca 9.8     ALK PHOS 66     T Bili 0.5     Total Protein 6.6     Alb 4.0     GLOBULIN 2.6     ALBUMIN/GLOBULIN RATIO 1.5     BUN/CREAT RATIO 27.0 (*)    ALT 20     AST 15     eGFR 74     Comment: Normal GFR (glomerular filtration rate) > 60 mL/min/1.73 meters squared, < 60 may include impaired kidney function. Calculation based on the Chronic Kidney Disease Epidemiology Collaboration (CK-EPI)equation refit without adjustment for race.  LIPASE - Abnormal   Lipase 78 (*)   URINALYSIS W/MICRO REFLEX CULTURE - SYMPTOMATIC - Normal   Urine Color Yellow     Urine Clarity Clear     Urine Specific Gravity 1.012     Urine pH 6.5     Urine Protein  - Dipstick Negative     Urine Glucose Negative     Urine Ketones Negative     Urine Bilirubin Negative     Urine Blood Negative     Urine Nitrite Negative     Urine Urobilinogen <2     Urine Leukocyte Esterase Negative     UA Microscopic No Micro     Narrative:    Does not meet criteria for reflex to Urine Culture.    Imaging:   CT ABDOMEN PELVIS W IV CONTRAST     ECG: ECG Results   None                                                                        Pre-Sedation Procedures    Medical Decision Making History and physical exam were reviewed. Differential includes but is not limited to muscle strain, nephrolithiasis, kidney infection, urinary tract infection.   Patient is extremely frustrated with the medical community as he feels nobody can figure out what is going on with him.  Patient states his heart rate and blood pressures are constantly fluctuating.  Patient states he thinks something is wrong with his pancreas because of this as he was googling. I did discuss.  Going to be getting labs to evaluate his kidney functions today and I was happy to add on a lipase to evaluate his pancreas. Surprisingly patient's lipase did come back mildly elevated as well as an elevated white count of 14.5  Patient was here 4 days ago and received x-rays of his lumbar and thoracic spine showed no significant abnormalities. Given patient's elevated white count and mildly elevated lipase with continued pain we will order CT scan for further evaluation. Currently pending. Patient's urine shows no sign of infection. Patient afebrile.  Discussed with ED attending Dr. Erlene at the end of my shift pending CT scan. Given patient's history of chronic pain, and not trying previous muscle relaxer and Toradol prescription written, I also do not feel comfortable prescribing narcotics for this patient. discussed this with patient prior to the end of  my  shift, he will need to follow-up with a primary care doctor who is constantly following with him or seek pain management again for further management.  Patient and wife do understand and are hopeful to follow-up with primary care, pain management and or [facility] for further workup and management. Patient did have an elevated white count of 14.5, patient denies any other sick symptoms.  States he always has muscle spasm's so it is hard for him to tell if there is anything other abnormal going on.  Does not look acutely ill.  Dr. Erlene will dispo patient after CT results, likely dispo home.  *This note was dictated with voice recognition software.  Similar sounding words or phrases can inadvertently be transcribed and may not be corrected upon review.   Amount and/or Complexity of Data Reviewed Labs: ordered. Radiology: ordered.  Risk Prescription drug management.       In reviewing the patient's old records, I reviewed their prior controlled substances prescriptions, using the PDMP.  Provider Communication  New Prescriptions   No medications on file    Modified Medications   No medications on file    Discontinued Medications   No medications on file    Clinical Impression Final diagnoses:  Acute bilateral low back pain without sciatica    ED Disposition     ED Disposition  Discharge   Condition  Stable   Comment  --                 Follow-up Information     Patria CHRISTELLA Grit, MD. Schedule an appointment as soon as possible for a visit in 2 days.   Specialties: Internal Medicine, Infectious Diseases Comments: Follow-up Contact information: [address] [place] [address] [phone]                  Electronically signed by:       [1] Social History Tobacco Use  Smoking Status Never  . Passive exposure: Never  Smokeless Tobacco Never  [2] Allergies Allergen Reactions  . Celebrex [Celecoxib] Palpitations and  Other    palpitations Chest tight-   . Hydromorphone  Hcl Hypotension  . Cymbalta [Duloxetine Hcl] Nausea And Vomiting  . Duloxetine Nausea And Vomiting  . Lyrica [Pregabalin] Other    Uncontrolled blood sugar    [employer], PA-C 12/06/23 0106

## 2023-12-06 NOTE — ED Provider Notes (Signed)
 [facility]  ED Progress Note    2:10 AM  I took over care of patient at shift change, I went to discuss all results with patient and family.  Answered all of their questions.  Patient given opportunity to ask questions and participate in treatment plan.  All findings, diagnosis, treatment plan discussed prior to discharge.  All concerns and questions were addressed.  Strict return precautions given  Electronically Signed by:   Harlene LITTIE Sample, MD 12/06/23 [phone]

## 2023-12-19 ENCOUNTER — Emergency Department (HOSPITAL_BASED_OUTPATIENT_CLINIC_OR_DEPARTMENT_OTHER)

## 2023-12-19 ENCOUNTER — Encounter (HOSPITAL_BASED_OUTPATIENT_CLINIC_OR_DEPARTMENT_OTHER): Payer: Self-pay | Admitting: Emergency Medicine

## 2023-12-19 ENCOUNTER — Emergency Department (HOSPITAL_BASED_OUTPATIENT_CLINIC_OR_DEPARTMENT_OTHER)
Admission: EM | Admit: 2023-12-19 | Discharge: 2023-12-19 | Disposition: A | Discharge status: Home / Self Care | Attending: Emergency Medicine | Admitting: Emergency Medicine

## 2023-12-19 DIAGNOSIS — R42 Dizziness and giddiness: Secondary | ICD-10-CM | POA: Insufficient documentation

## 2023-12-19 DIAGNOSIS — I10 Essential (primary) hypertension: Secondary | ICD-10-CM | POA: Insufficient documentation

## 2023-12-19 DIAGNOSIS — R0789 Other chest pain: Secondary | ICD-10-CM | POA: Insufficient documentation

## 2023-12-19 DIAGNOSIS — I2510 Atherosclerotic heart disease of native coronary artery without angina pectoris: Secondary | ICD-10-CM | POA: Diagnosis not present

## 2023-12-19 DIAGNOSIS — R0602 Shortness of breath: Secondary | ICD-10-CM | POA: Insufficient documentation

## 2023-12-19 DIAGNOSIS — D72829 Elevated white blood cell count, unspecified: Secondary | ICD-10-CM | POA: Diagnosis not present

## 2023-12-19 DIAGNOSIS — Z7984 Long term (current) use of oral hypoglycemic drugs: Secondary | ICD-10-CM | POA: Insufficient documentation

## 2023-12-19 DIAGNOSIS — R600 Localized edema: Secondary | ICD-10-CM | POA: Diagnosis not present

## 2023-12-19 DIAGNOSIS — E119 Type 2 diabetes mellitus without complications: Secondary | ICD-10-CM | POA: Insufficient documentation

## 2023-12-19 DIAGNOSIS — R110 Nausea: Secondary | ICD-10-CM | POA: Insufficient documentation

## 2023-12-19 DIAGNOSIS — R7989 Other specified abnormal findings of blood chemistry: Secondary | ICD-10-CM | POA: Diagnosis not present

## 2023-12-19 DIAGNOSIS — J45909 Unspecified asthma, uncomplicated: Secondary | ICD-10-CM | POA: Insufficient documentation

## 2023-12-19 DIAGNOSIS — R079 Chest pain, unspecified: Secondary | ICD-10-CM | POA: Diagnosis present

## 2023-12-19 HISTORY — DX: Atherosclerotic heart disease of native coronary artery without angina pectoris: I25.10

## 2023-12-19 LAB — COMPREHENSIVE METABOLIC PANEL WITH GFR
ALT: 31 U/L (ref 0–44)
AST: 22 U/L (ref 15–41)
Albumin: 4 g/dL (ref 3.5–5.0)
Alkaline Phosphatase: 67 U/L (ref 38–126)
Anion gap: 15 (ref 5–15)
BUN: 22 mg/dL (ref 8–23)
CO2: 23 mmol/L (ref 22–32)
Calcium: 9.9 mg/dL (ref 8.9–10.3)
Chloride: 99 mmol/L (ref 98–111)
Creatinine, Ser: 1.3 mg/dL — ABNORMAL HIGH (ref 0.61–1.24)
GFR, Estimated: >60 mL/min (ref >60)
Glucose, Bld: 146 mg/dL — ABNORMAL HIGH (ref 70–99)
Potassium: 5.1 mmol/L (ref 3.5–5.1)
Sodium: 136 mmol/L (ref 135–145)
Total Bilirubin: 0.6 mg/dL (ref 0.0–1.2)
Total Protein: 6.5 g/dL (ref 6.5–8.1)

## 2023-12-19 LAB — TROPONIN T, HIGH SENSITIVITY
Troponin T High Sensitivity: 43 ng/L — ABNORMAL HIGH (ref 0–19)
Troponin T High Sensitivity: 33 ng/L — ABNORMAL HIGH (ref 0–19)

## 2023-12-19 LAB — CBC
HCT: 42.3 % (ref 39.0–52.0)
Hemoglobin: 14.4 g/dL (ref 13.0–17.0)
MCH: 30.8 pg (ref 26.0–34.0)
MCHC: 34 g/dL (ref 30.0–36.0)
MCV: 90.4 fL (ref 80.0–100.0)
Platelets: 282 K/uL (ref 150–400)
RBC: 4.68 MIL/uL (ref 4.22–5.81)
RDW: 14.3 % (ref 11.5–15.5)
WBC: 13.2 K/uL — ABNORMAL HIGH (ref 4.0–10.5)
nRBC: 0 % (ref 0.0–0.2)

## 2023-12-19 LAB — PRO BRAIN NATRIURETIC PEPTIDE: Pro Brain Natriuretic Peptide: 189 pg/mL (ref <300.0)

## 2023-12-19 MED ORDER — SODIUM CHLORIDE 0.9 % IV BOLUS
1000.0000 mL | Freq: Once | INTRAVENOUS | Status: AC
  Administered 2023-12-19: 1000 mL via INTRAVENOUS

## 2023-12-19 NOTE — ED Provider Notes (Signed)
 [facility] Provider Note   CSN: [id number] Arrival date & time: 12/19/23  1330     Patient presents with: Chest Pain   Nathan Durham is a 63 y.o. male with history of paroxysmal A-fib on Xarelto, COPD, CAD status post CABG, maze, PVI, appendage ligation, hypothyroidism, DM on insulin presents with complaints of chest pain.  Patient reports that he has been having this pain intermittently for the past 13 years.  Reports pain comes and goes without any obvious pattern.  Today's pain started 2 hours ago.  It is localized centrally does not radiate.  Ports that he does feel some shortness of breath, dizziness and nausea as well.  Reports that his stamina has dramatically declined over the past few months.    Chest Pain     Past Medical History:  Diagnosis Date   Anxiety    Asthma    Cervical spondylosis    Chronic pain    Coronary artery disease    Crohn disease (HCC)    Depression    Diabetes mellitus without complication (HCC)    Hyperlipidemia    Hypertension    Hypomagnesemia    Kidney stones    Myalgia    Stenosis of carotid artery    Thyroid disease      Prior to Admission medications   Medication Sig Start Date End Date Taking? Authorizing Provider  allopurinol (ZYLOPRIM) 100 MG tablet Take 100 mg by mouth daily.    [provider]  Cinnamon 500 MG capsule Take 500 mg by mouth daily.    [provider]  gabapentin  (NEURONTIN ) 300 MG capsule Take 1 capsule (300 mg total) by mouth 3 (three) times daily. 05/09/14   Onita Duos, MD  GLIPIZIDE PO Take by mouth.    [provider]  hydrochlorothiazide (HYDRODIURIL) 25 MG tablet Take 25 mg by mouth daily.    [provider]  HYDROcodone -acetaminophen  (NORCO/VICODIN) 5-325 MG per tablet Take 1-2 tablets by mouth every 6 (six) hours as needed for moderate pain or severe pain. 01/18/14   Doretha Folks, MD  levothyroxine (SYNTHROID,  LEVOTHROID) 75 MCG tablet Take 75 mcg by mouth daily before breakfast.    [provider]  Magnesium 400 MG TABS Take by mouth daily.    [provider]  metFORMIN (GLUCOPHAGE) 500 MG tablet Take 500 mg by mouth 2 (two) times daily with a meal.    [provider]  Multiple Vitamin (MULTIVITAMIN) capsule Take 1 capsule by mouth daily.    [provider]  Multiple Vitamins-Minerals (MULTIVITAMIN PO) Take by mouth.    [provider]  omega-3 acid ethyl esters (LOVAZA) 1 G capsule Take by mouth daily.    [provider]  orphenadrine (NORFLEX) 100 MG tablet Take 100 mg by mouth 2 (two) times daily.    [provider]  Oxycodone -Acetaminophen  (PERCOCET PO) Take by mouth.    [provider]  predniSONE  (DELTASONE ) 10 MG tablet Take 10 mg by mouth daily with breakfast.    [provider]  sitaGLIPtin-metformin (JANUMET) 50-1000 MG per tablet Take 1 tablet by mouth 2 (two) times daily with a meal.    [provider]    Allergies: Hydromorphone , Cymbalta [duloxetine hcl], Duloxetine, Pregabalin, and Celebrex [celecoxib]    Review of Systems  Cardiovascular:  Positive for chest pain.    Updated Vital Signs BP 134/60   Pulse 63   Temp 98.4 F (36.9 C) (Oral)  Resp (!) 24   Ht 5' 10 (1.778 m)   Wt 104.8 kg   SpO2 98%   BMI 33.15 kg/m   Physical Exam Vitals and nursing note reviewed.  Constitutional:      General: He is not in acute distress.    Appearance: He is well-developed.  HENT:     Head: Normocephalic and atraumatic.  Eyes:     Conjunctiva/sclera: Conjunctivae normal.  Cardiovascular:     Rate and Rhythm: Normal rate and regular rhythm.     Heart sounds: No murmur heard. Pulmonary:     Effort: Pulmonary effort is normal. No respiratory distress.     Breath sounds: Normal breath sounds.  Abdominal:     Palpations: Abdomen is soft.     Tenderness: There is no abdominal tenderness.   Musculoskeletal:        General: Swelling present.     Cervical back: Neck supple.     Comments: 1+ bilateral pitting edema  Skin:    General: Skin is warm and dry.     Capillary Refill: Capillary refill takes less than 2 seconds.  Neurological:     Mental Status: He is alert.  Psychiatric:        Mood and Affect: Mood normal.     (all labs ordered are listed, but only abnormal results are displayed) Labs Reviewed  CBC - Abnormal; Notable for the following components:      Result Value   WBC 13.2 (*)    All other components within normal limits  COMPREHENSIVE METABOLIC PANEL WITH GFR - Abnormal; Notable for the following components:   Glucose, Bld 146 (*)    Creatinine, Ser 1.30 (*)    All other components within normal limits  TROPONIN T, HIGH SENSITIVITY - Abnormal; Notable for the following components:   Troponin T High Sensitivity 43 (*)    All other components within normal limits  TROPONIN T, HIGH SENSITIVITY - Abnormal; Notable for the following components:   Troponin T High Sensitivity 33 (*)    All other components within normal limits  PRO BRAIN NATRIURETIC PEPTIDE    EKG: EKG Interpretation Date/Time:  Saturday December 19 2023 15:05:16 EDT Ventricular Rate:  60 PR Interval:  176 QRS Duration:  133 QT Interval:  422 QTC Calculation: 422 R Axis:   12  Text Interpretation: Sinus rhythm Right bundle branch block Inferior infarct, old Confirmed by Elnor Savant ([id number]) on 12/19/2023 5:13:45 PM  Radiology: [facility] Chest Port 1 View Result Date: 12/19/2023 CLINICAL DATA:  Chest pain. EXAM: PORTABLE CHEST 1 VIEW COMPARISON:  Radiograph 06/09/2012 FINDINGS: Prior median sternotomy. Left atrial clipping. The heart is upper normal in size for technique. No confluent opacity, large pleural effusion or pneumothorax. No pulmonary edema. On limited assessment, no acute osseous findings. IMPRESSION: No active disease. Electronically Signed   By: Andrea Gasman M.D.   On:  12/19/2023 14:50     Procedures   Medications Ordered in the ED  sodium chloride  0.9 % bolus 1,000 mL (0 mLs Intravenous Stopped 12/19/23 1605)    Clinical Course as of 12/19/23 1747  Sat Dec 19, 2023  1422 Patient with history of significant cardiovascular history evaluated for complaints of acute on chronic chest pain x 2 hours with associated shortness of breath, dizziness and nausea.  He is hemodynamically stable.  On exam his lung sounds are diminished without any rales or wheezing.  He has no reproducible chest wall tenderness.  Will obtain cardiovascular workup. [JT]  1436 EKG 12-Lead Sinus rhythm, right bundle branch block, inferior Q waves [JT]  1437 CBC(!) Leukocytosis to 13.2 [JT]  1449 Troponin T, High Sensitivity(!) Mildly elevated at 43 [JT]  1450 Pro Brain natriuretic peptide Within normal limits [JT]  1450 Comprehensive metabolic panel(!) Creatinine mildly elevated 1.30, baseline around 1.05 [JT]  1455 DG Chest Port 1 View No acute abnormality [JT]  1659 Troponin T, High Sensitivity(!) Delta troponin downtrending to 33 [JT]  1745 Patient reevaluated he confirms pain has ceased.  Reports compliance with his Xarelto.  Do not suspect PE at this time.  Chart review demonstrates troponins are consistently in the 30s.  Again reiterates that his symptoms have been like this for 13 years.  They intermittently occur and he has a negative workup.  Given that he is high risk offered admission for observation, cardiology consult or discharge with close cardiology follow-up.  Patient would prefer the latter.  He is interested in discharge at this time.  Strict return precautions provided.  Patient is understanding treatment plan. [JT]    Clinical Course User Index [JT] Donnajean Lynwood DEL, PA-C                                 Medical Decision Making Amount and/or Complexity of Data Reviewed Labs: ordered. Decision-making details documented in ED Course. Radiology: ordered.  Decision-making details documented in ED Course. ECG/medicine tests:  Decision-making details documented in ED Course.   This patient presents to the ED with chief complaint(s) of chest pain.  The complaint involves an extensive differential diagnosis and also carries with it a high risk of complications and morbidity.   Pertinent past medical history as listed in HPI  The differential diagnosis includes  Considered pneumonia however patient is afebrile, lungs are clear with no evidence of consolidation on x-ray.  Low suspicion for aortic dissection based off exam and history.  Patient with no risk factors for PE.  He is additionally on Xarelto.  Additional history obtained: Records reviewed Care Everywhere/External Records  Disposition:   Patient will be discharged home. The patient has been appropriately medically screened and/or stabilized in the ED. I have low suspicion for any other emergent medical condition which would require further screening, evaluation or treatment in the ED or require inpatient management. At time of discharge the patient is hemodynamically stable and in no acute distress. I have discussed work-up results and diagnosis with patient and answered all questions. Patient is agreeable with discharge plan. We discussed strict return precautions for returning to the emergency department and they verbalized understanding.     Social Determinants of Health:   none  This note was dictated with voice recognition software.  Despite best efforts at proofreading, errors may have occurred which can change the documentation meaning.       Final diagnoses:  Atypical chest pain    ED Discharge Orders          Ordered    Ambulatory referral to Cardiology       Comments: If you have not heard from the Cardiology office within the next 72 hours please call [phone].   12/19/23 1747               Donnajean Lynwood DEL, PA-C 12/19/23 1747    Elnor Jayson LABOR,  DO 12/24/23 2349

## 2023-12-19 NOTE — ED Triage Notes (Signed)
 Pt reports intermittent central CP x 13 yrs, but became worse about 1 hr ago; gets lightheaded ans SHOB at times

## 2023-12-19 NOTE — Discharge Instructions (Addendum)
 You were evaluated in the emergency room for chest pain.  Your lab work and imaging did not show any significant abnormality.  You are provided a referral for cardiology.  You should be expecting a call within the next week.  If you experience any new or worsening symptoms please return to emergency room.

## 2023-12-19 NOTE — ED Notes (Signed)

## 2024-02-04 DIAGNOSIS — F419 Anxiety disorder, unspecified: Secondary | ICD-10-CM | POA: Insufficient documentation

## 2024-02-04 DIAGNOSIS — E785 Hyperlipidemia, unspecified: Secondary | ICD-10-CM | POA: Insufficient documentation

## 2024-02-04 DIAGNOSIS — K5090 Crohn's disease, unspecified, without complications: Secondary | ICD-10-CM | POA: Insufficient documentation

## 2024-02-04 DIAGNOSIS — N200 Calculus of kidney: Secondary | ICD-10-CM | POA: Insufficient documentation

## 2024-02-04 DIAGNOSIS — E079 Disorder of thyroid, unspecified: Secondary | ICD-10-CM | POA: Insufficient documentation

## 2024-02-04 DIAGNOSIS — M47812 Spondylosis without myelopathy or radiculopathy, cervical region: Secondary | ICD-10-CM | POA: Insufficient documentation

## 2024-02-04 DIAGNOSIS — G8929 Other chronic pain: Secondary | ICD-10-CM | POA: Insufficient documentation

## 2024-02-04 DIAGNOSIS — I2510 Atherosclerotic heart disease of native coronary artery without angina pectoris: Secondary | ICD-10-CM | POA: Insufficient documentation

## 2024-02-04 DIAGNOSIS — E119 Type 2 diabetes mellitus without complications: Secondary | ICD-10-CM | POA: Insufficient documentation

## 2024-02-04 DIAGNOSIS — I6529 Occlusion and stenosis of unspecified carotid artery: Secondary | ICD-10-CM | POA: Insufficient documentation

## 2024-02-04 DIAGNOSIS — J45909 Unspecified asthma, uncomplicated: Secondary | ICD-10-CM | POA: Insufficient documentation

## 2024-02-04 DIAGNOSIS — I10 Essential (primary) hypertension: Secondary | ICD-10-CM | POA: Insufficient documentation

## 2024-02-04 DIAGNOSIS — F32A Depression, unspecified: Secondary | ICD-10-CM | POA: Insufficient documentation

## 2024-02-10 ENCOUNTER — Ambulatory Visit: Attending: Cardiology | Admitting: Cardiology

## 2024-02-10 ENCOUNTER — Encounter: Payer: Self-pay | Admitting: Cardiology

## 2024-02-10 VITALS — BP 122/60 | HR 60 | Ht 70.0 in | Wt 233.1 lb

## 2024-02-10 DIAGNOSIS — E119 Type 2 diabetes mellitus without complications: Secondary | ICD-10-CM | POA: Diagnosis not present

## 2024-02-10 DIAGNOSIS — I10 Essential (primary) hypertension: Secondary | ICD-10-CM

## 2024-02-10 DIAGNOSIS — I2510 Atherosclerotic heart disease of native coronary artery without angina pectoris: Secondary | ICD-10-CM

## 2024-02-10 DIAGNOSIS — I480 Paroxysmal atrial fibrillation: Secondary | ICD-10-CM | POA: Diagnosis not present

## 2024-02-10 DIAGNOSIS — E66811 Obesity, class 1: Secondary | ICD-10-CM | POA: Insufficient documentation

## 2024-02-10 DIAGNOSIS — Z9889 Other specified postprocedural states: Secondary | ICD-10-CM

## 2024-02-10 DIAGNOSIS — Z8679 Personal history of other diseases of the circulatory system: Secondary | ICD-10-CM | POA: Insufficient documentation

## 2024-02-10 DIAGNOSIS — R079 Chest pain, unspecified: Secondary | ICD-10-CM | POA: Insufficient documentation

## 2024-02-10 DIAGNOSIS — E782 Mixed hyperlipidemia: Secondary | ICD-10-CM

## 2024-02-10 MED ORDER — NITROGLYCERIN 0.4 MG SL SUBL: 0.4000 mg | SUBLINGUAL_TABLET | SUBLINGUAL | 6 refills | Status: AC | PRN

## 2024-02-10 NOTE — Progress Notes (Signed)
 Cardiology Office Note:    Date:  02/10/2024   ID:  Saatvik Durham, DOB [birth year], MRN [id number]  PCP:  [facility]  Cardiologist:  Jennifer JONELLE Crape, MD   Referring MD: Donnajean Lynwood DEL, PA-C    ASSESSMENT:    1. Hypertension, unspecified type   2. Coronary artery disease involving native coronary artery of native heart without angina pectoris   3. Paroxysmal atrial fibrillation (HCC)   4. Diabetes mellitus without complication (HCC)   5. Mixed hyperlipidemia   6. Chest pain of uncertain etiology   7. S/P ablation of atrial fibrillation   8. Obesity (BMI 30.0-34.9)    PLAN:    In order of problems listed above:  Coronary artery disease post CABG surgery: Chest pain: Atypical in nature but in view of risk factors and diabetes mellitus we will do Lexiscan sestamibi to assess this.  Sublingual nitroglycerin  prescription was sent, its protocol and 911 protocol explained and the patient vocalized understanding questions were answered to the patient's satisfaction Essential hypertension: Blood pressure is stable and diet was emphasized.  Lifestyle modification urged. Mixed dyslipidemia: On statin therapy followed by primary care.  Goal LDL less than 60. Paroxysmal atrial fibrillation: Post ablation: Successful:I discussed with the patient atrial fibrillation, disease process. Management and therapy including rate and rhythm control, anticoagulation benefits and potential risks were discussed extensively with the patient. Patient had multiple questions which were answered to patient's satisfaction. Cardiac murmur: Echocardiogram will be done to assess murmur heard on auscultation. Patient will be seen in follow-up appointment in 6 months or earlier if the patient has any concerns.   Medication Adjustments/Labs and Tests Ordered: Current medicines are reviewed at length with the patient today.  Concerns regarding medicines are outlined above.  Orders Placed This Encounter   Procedures   EKG 12-Lead   No orders of the defined types were placed in this encounter.    History of Present Illness:    Nathan Durham is a 63 y.o. male who is being seen today for the evaluation of chest pain at the request of Donnajean Lynwood DEL DEVONNA.  Patient has past medical history of coronary artery disease post CABG surgery, atrial fibrillation post unsuccessful ablation, essential hypertension, mixed dyslipidemia, diabetes mellitus and obesity.  He mentions to me that he has chest pain and shortness of breath on exertion but this is going on for a very long time.  He is here to be established.  At the time of my evaluation, the patient is alert awake oriented and in no distress.  Past Medical History:  Diagnosis Date   Abnormality of gait 04/13/2014   Anxiety    Asthma    Cervical disc disorder with radiculopathy of cervical region 05/02/2014   Cervical spondylosis    Chronic pain    Coronary artery disease    Crohn disease (HCC)    Depression    Diabetes mellitus without complication (HCC)    Hyperlipidemia    Hypertension    Hypomagnesemia    Kidney stones    Myalgia    Neck pain 04/13/2014   Pain 05/09/2014   Slow transit constipation 04/13/2014   Stenosis of carotid artery    Thyroid disease     Past Surgical History:  Procedure Laterality Date   CORONARY ARTERY BYPASS GRAFT     KIDNEY STONE SURGERY      Current Medications: Current Meds  Medication Sig   aspirin EC 81 MG tablet Take 81 mg by mouth daily.  Swallow whole.   atorvastatin (LIPITOR) 40 MG tablet Take 40 mg by mouth daily.   diltiazem (CARDIZEM CD) 120 MG 24 hr capsule Take 120 mg by mouth daily.   furosemide (LASIX) 40 MG tablet Take 40 mg by mouth daily.   insulin regular (NOVOLIN R) 100 units/mL injection Inject 2-4 Units into the skin as directed.   levothyroxine (SYNTHROID) 50 MCG tablet Take 50 mcg by mouth daily.   losartan (COZAAR) 25 MG tablet Take 25 mg by mouth 2 (two) times  daily.   metFORMIN (GLUCOPHAGE) 1000 MG tablet Take 1,000 mg by mouth 2 (two) times daily with a meal.   metoprolol tartrate (LOPRESSOR) 50 MG tablet Take 50 mg by mouth 2 (two) times daily.   Multiple Vitamin (MULTIVITAMIN) capsule Take 1 capsule by mouth daily.   potassium chloride SA (KLOR-CON M) 20 MEQ tablet Take 20 mEq by mouth daily.   predniSONE  (DELTASONE ) 20 MG tablet Take 10 mg by mouth daily.   rivaroxaban (XARELTO) 20 MG TABS tablet Take 20 mg by mouth daily.   spironolactone (ALDACTONE) 25 MG tablet Take 12.5 mg by mouth daily.   tamsulosin (FLOMAX) 0.4 MG CAPS capsule Take 0.4 mg by mouth daily.     Allergies:   Hydromorphone , Cymbalta [duloxetine hcl], Duloxetine, Pregabalin, and Celebrex [celecoxib]   Social History   Socioeconomic History   Marital status: Legally Separated    Spouse name: Not on file   Number of children: 1   Years of education: 12 years   Highest education level: Not on file  Occupational History   Occupation: disabled  Tobacco Use   Smoking status: Never   Smokeless tobacco: Never  Substance and Sexual Activity   Alcohol use: No    Alcohol/week: 0.0 standard drinks of alcohol   Drug use: No   Sexual activity: Not on file  Other Topics Concern   Not on file  Social History Narrative   Live with parents.   1-2 cups/daily   Right-handed   Social Drivers of Health   Financial Resource Strain: Low Risk  (04/01/2022)   Received from [facility]   Overall Financial Resource Strain (CARDIA)    Difficulty of Paying Living Expenses: Not very hard  Food Insecurity: No Food Insecurity (03/19/2023)   [facility] from [facility]   Hunger Vital Sign    Within the past 12 months, you worried that your food would run out before you got the money to buy more.: Never true    Within the past 12 months, the food you bought just didn't last and you didn't have money to get more.: Never true  Transportation Needs: No Transportation Needs (03/19/2023)    Received from [facility] - Transportation    Lack of Transportation (Medical): No    Lack of Transportation (Non-Medical): No  Physical Activity: Not on file  Stress: No Stress Concern Present (05/19/2023)   Received from [facility] - Occupational Stress Questionnaire    Feeling of Stress : Not at all  Social Connections: Not on file     Family History: The patient's family history includes Arthritis in his mother.  ROS:   Please see the history of present illness.    All other systems reviewed and are negative.  EKGs/Labs/Other Studies Reviewed:    The following studies were reviewed today:  EKG Interpretation Date/Time:  Wednesday February 10 2024 14:49:47 EST Ventricular Rate:  60 PR Interval:  164 QRS Duration:  124 QT Interval:  404 QTC Calculation: 404 R Axis:   -14  Text Interpretation: Normal sinus rhythm Right bundle branch block Inferior infarct , age undetermined When compared with ECG of 19-Dec-2023 15:05, PREVIOUS ECG IS PRESENT Confirmed by Edwyna Backers [phone]) on 02/10/2024 3:15:46 PM     Recent Labs: 12/19/2023: ALT 31; BUN 22; Creatinine, Ser 1.30; Hemoglobin 14.4; Platelets 282; Potassium 5.1; Pro Brain Natriuretic Peptide 189.0; Sodium 136  Recent Lipid Panel No results found for: CHOL, TRIG, HDL, CHOLHDL, VLDL, LDLCALC, LDLDIRECT  Physical Exam:    VS:  BP 122/60   Pulse 60   Ht 5' 10 (1.778 m)   Wt 233 lb 1.3 oz (105.7 kg)   SpO2 95%   BMI 33.44 kg/m     Wt Readings from Last 3 Encounters:  02/10/24 233 lb 1.3 oz (105.7 kg)  12/19/23 231 lb (104.8 kg)  09/06/14 223 lb (101.2 kg)     GEN: Patient is in no acute distress HEENT: Normal NECK: No JVD; No carotid bruits LYMPHATICS: No lymphadenopathy CARDIAC: S1 S2 regular, 2/6 systolic murmur at the apex. RESPIRATORY:  Clear to auscultation without rales, wheezing or rhonchi  ABDOMEN: Soft, non-tender,  non-distended MUSCULOSKELETAL:  No edema; No deformity  SKIN: Warm and dry NEUROLOGIC:  Alert and oriented x 3 PSYCHIATRIC:  Normal affect    Signed, Backers JONELLE Edwyna, MD  02/10/2024 3:25 PM    [facility]

## 2024-02-10 NOTE — Patient Instructions (Addendum)
 Medication Instructions:  Your physician has recommended you make the following change in your medication:  Start taking Nitroglycerin  as needed for chest pain.  The proper use and anticipated side effects of nitroglycerine has been carefully explained.  If a single episode of chest pain is not relieved by one tablet, the patient will try another within 5 minutes; and if this doesn't relieve the pain, the patient is instructed to call 911 for transportation to an emergency department.   *If you need a refill on your cardiac medications before your next appointment, please call your pharmacy*   Lab Work: None ordered If you have labs (blood work) drawn today and your tests are completely normal, you will receive your results only by: MyChart Message (if you have MyChart) OR A paper copy in the mail If you have any lab test that is abnormal or we need to change your treatment, we will call you to review the results.   Testing/Procedures: [facility] [address], [place], [place] [address]   Please arrive 15 minutes prior to your appointment time for registration and insurance purposes.  The test will take approximately 3 to 4 hours to complete; you may bring reading material.  If someone comes with you to your appointment, they will need to remain in the main lobby due to limited space in the testing area. **If you are pregnant or breastfeeding, please notify the nuclear lab prior to your appointment**  How to prepare for your Myocardial Perfusion Test: Do not eat or drink 3 hours prior to your test, except you may have water. Do not consume products containing caffeine (regular or decaffeinated) 12 hours prior to your test. (ex: coffee, chocolate, sodas, tea). Do bring a list of your current medications with you.  If not listed below, you may take your medications as normal. Do not take metoprolol (Lopressor, Toprol) for 24 hours prior to the test.  Bring the  medication to your appointment as you may be required to take it once the test is complete. Do wear comfortable clothes (no dresses or overalls) and walking shoes, tennis shoes preferred (No heels or open toe shoes are allowed). Do NOT wear cologne, perfume, aftershave, or lotions (deodorant is allowed). If these instructions are not followed, your test will have to be rescheduled.  If you cannot keep your appointment, please provide 24 hours notification to the Nuclear Lab, to avoid a possible $50 charge to your account.     Your physician has requested that you have an echocardiogram. Echocardiography is a painless test that uses sound waves to create images of your heart. It provides your doctor with information about the size and shape of your heart and how well your heart's chambers and valves are working. This procedure takes approximately one hour. There are no restrictions for this procedure. Please do NOT wear cologne, perfume, aftershave, or lotions (deodorant is allowed). Please arrive 15 minutes prior to your appointment time.  Please note: We ask at that you not bring children with you during ultrasound (echo/ vascular) testing. Due to room size and safety concerns, children are not allowed in the ultrasound rooms during exams. Our front office staff cannot provide observation of children in our lobby area while testing is being conducted. An adult accompanying a patient to their appointment will only be allowed in the ultrasound room at the discretion of the ultrasound technician under special circumstances. We apologize for any inconvenience.    Follow-Up: At [facility], you and your health needs are our priority.  As part of our continuing mission to provide you with exceptional heart care, we have created designated Provider Care Teams.  These Care Teams include your primary Cardiologist (physician) and Advanced Practice Providers (APPs -  Physician Assistants and Nurse  Practitioners) who all work together to provide you with the care you need, when you need it.  We recommend signing up for the patient portal called MyChart.  Sign up information is provided on this After Visit Summary.  MyChart is used to connect with patients for Virtual Visits (Telemedicine).  Patients are able to view lab/test results, encounter notes, upcoming appointments, etc.  Non-urgent messages can be sent to your provider as well.   To learn more about what you can do with MyChart, go to [email or web address].    Your next appointment:   9 month(s)  Provider:   Jennifer Crape, MD   Other Instructions  Cardiac Nuclear Scan A cardiac nuclear scan is a test that is done to check the flow of blood to your heart. It is done when you are resting and when you are exercising. The test looks for problems such as: Not enough blood reaching a portion of the heart. The heart muscle not working as it should. You may need this test if you have: Heart disease. Lab results that are not normal. Had heart surgery or a balloon procedure to open up blocked arteries (angioplasty) or a small mesh tube (stent). Chest pain. Shortness of breath. Had a heart attack. In this test, a special dye (tracer) is put into your bloodstream. The tracer will travel to your heart. A camera will then take pictures of your heart to see how the tracer moves through your heart. This test is usually done at a hospital and takes 2-4 hours. Tell a doctor about: Any allergies you have. All medicines you are taking, including vitamins, herbs, eye drops, creams, and over-the-counter medicines. Any bleeding problems you have. Any surgeries you have had. Any medical conditions you have. Whether you are pregnant or may be pregnant. Any history of asthma or long-term (chronic) lung disease. Any history of heart rhythm disorders or heart valve conditions. What are the risks? Your doctor will talk with you about risks.  These may include: Serious chest pain and heart attack. This is only a risk if the stress portion of the test is done. Fast or uneven heartbeats (palpitations). A feeling of warmth in your chest. This feeling usually does not last long. Allergic reaction to the tracer. Shortness of breath or trouble breathing. What happens before the test? Ask your doctor about changing or stopping your normal medicines. Follow instructions from your doctor about what you cannot eat or drink. Remove your jewelry on the day of the test. Ask your doctor if you need to avoid nicotine or caffeine. What happens during the test? An IV tube will be inserted into one of your veins. Your doctor will give you a small amount of tracer through the IV tube. You will wait for 20-40 minutes while the tracer moves through your bloodstream. Your heart will be monitored with an electrocardiogram (ECG). You will lie down on an exam table. Pictures of your heart will be taken for about 15-20 minutes. You may also have a stress test. For this test, one of these things may be done: You will be asked to exercise on a treadmill or a stationary bike. You will be given medicines that will  make your heart work harder. This is done if you are unable to exercise. When blood flow to your heart has peaked, a tracer will again be given through the IV tube. After 20-40 minutes, you will get back on the exam table. More pictures will be taken of your heart. Depending on the tracer that is used, more pictures may need to be taken 3-4 hours later. Your IV tube will be removed when the test is over. The test may vary among doctors and hospitals. What happens after the test? Ask your doctor: Whether you can return to your normal schedule, including diet, activities, travel, and medicines. Whether you should drink more fluids. This will help to remove the tracer from your body. Ask your doctor, or the department that is doing the test: When  will my results be ready? How will I get my results? What are my treatment options? What other tests do I need? What are my next steps? This information is not intended to replace advice given to you by your health care provider. Make sure you discuss any questions you have with your health care provider. Document Revised: 07/30/2021 Document Reviewed: 07/30/2021 Elsevier Patient Education  2023 Elsevier Inc.  Echocardiogram An echocardiogram is a test that uses sound waves (ultrasound) to produce images of the heart. Images from an echocardiogram can provide important information about: Heart size and shape. The size and thickness and movement of your heart's walls. Heart muscle function and strength. Heart valve function or if you have stenosis. Stenosis is when the heart valves are too narrow. If blood is flowing backward through the heart valves (regurgitation). A tumor or infectious growth around the heart valves. Areas of heart muscle that are not working well because of poor blood flow or injury from a heart attack. Aneurysm detection. An aneurysm is a weak or damaged part of an artery wall. The wall bulges out from the normal force of blood pumping through the body. Tell a health care provider about: Any allergies you have. All medicines you are taking, including vitamins, herbs, eye drops, creams, and over-the-counter medicines. Any blood disorders you have. Any surgeries you have had. Any medical conditions you have. Whether you are pregnant or may be pregnant. What are the risks? Generally, this is a safe test. However, problems may occur, including an allergic reaction to dye (contrast) that may be used during the test. What happens before the test? No specific preparation is needed. You may eat and drink normally. What happens during the test?  You will take off your clothes from the waist up and put on a hospital gown. Electrodes or electrocardiogram (ECG)patches may  be placed on your chest. The electrodes or patches are then connected to a device that monitors your heart rate and rhythm. You will lie down on a table for an ultrasound exam. A gel will be applied to your chest to help sound waves pass through your skin. A handheld device, called a transducer, will be pressed against your chest and moved over your heart. The transducer produces sound waves that travel to your heart and bounce back (or echo back) to the transducer. These sound waves will be captured in real-time and changed into images of your heart that can be viewed on a video monitor. The images will be recorded on a computer and reviewed by your health care provider. You may be asked to change positions or hold your breath for a short time. This makes it easier to get  different views or better views of your heart. In some cases, you may receive contrast through an IV in one of your veins. This can improve the quality of the pictures from your heart. The procedure may vary among health care providers and hospitals. What can I expect after the test? You may return to your normal, everyday life, including diet, activities, and medicines, unless your health care provider tells you not to do that. Follow these instructions at home: It is up to you to get the results of your test. Ask your health care provider, or the department that is doing the test, when your results will be ready. Keep all follow-up visits. This is important. Summary An echocardiogram is a test that uses sound waves (ultrasound) to produce images of the heart. Images from an echocardiogram can provide important information about the size and shape of your heart, heart muscle function, heart valve function, and other possible heart problems. You do not need to do anything to prepare before this test. You may eat and drink normally. After the echocardiogram is completed, you may return to your normal, everyday life, unless your  health care provider tells you not to do that. This information is not intended to replace advice given to you by your health care provider. Make sure you discuss any questions you have with your health care provider. Document Revised: 11/14/2020 Document Reviewed: 10/25/2019 Elsevier Patient Education  2023 Elsevier Inc.    Important Information About Sugar

## 2024-03-04 ENCOUNTER — Telehealth (HOSPITAL_COMMUNITY): Payer: Self-pay | Admitting: *Deleted

## 2024-03-04 NOTE — Telephone Encounter (Signed)
 Patient given detailed instructions per Myocardial Perfusion Study Information Sheet for the test on 03/11/2024 at 8:00. Patient notified to arrive 15 minutes early and that it is imperative to arrive on time for appointment to keep from having the test rescheduled.  If you need to cancel or reschedule your appointment, please call the office within 24 hours of your appointment. . Patient verbalized understanding.Nathan Durham

## 2024-03-07 ENCOUNTER — Ambulatory Visit (HOSPITAL_BASED_OUTPATIENT_CLINIC_OR_DEPARTMENT_OTHER)
Admission: RE | Admit: 2024-03-07 | Discharge: 2024-03-07 | Disposition: A | Discharge status: Home / Self Care | Source: Ambulatory Visit | Attending: Cardiology | Admitting: Cardiology

## 2024-03-07 ENCOUNTER — Other Ambulatory Visit: Payer: Self-pay | Admitting: Cardiology

## 2024-03-07 DIAGNOSIS — R079 Chest pain, unspecified: Secondary | ICD-10-CM | POA: Insufficient documentation

## 2024-03-07 LAB — ECHOCARDIOGRAM COMPLETE
AR max vel: 2.09 cm2
AV Area VTI: 2.47 cm2
AV Area mean vel: 2.22 cm2
AV Mean grad: 6 mmHg
AV Peak grad: 11.2 mmHg
Ao pk vel: 1.67 m/s
Area-P 1/2: 3.74 cm2
Calc EF: 64.1 %
MV M vel: 1.1 m/s
MV Peak grad: 4.8 mmHg
S' Lateral: 3.1 cm
Single Plane A2C EF: 65 %
Single Plane A4C EF: 61.9 %

## 2024-03-08 ENCOUNTER — Ambulatory Visit: Payer: Self-pay | Admitting: Cardiology

## 2024-03-08 DIAGNOSIS — I480 Paroxysmal atrial fibrillation: Secondary | ICD-10-CM

## 2024-03-08 DIAGNOSIS — R002 Palpitations: Secondary | ICD-10-CM

## 2024-03-11 ENCOUNTER — Ambulatory Visit: Payer: Self-pay | Admitting: Cardiology

## 2024-03-11 ENCOUNTER — Ambulatory Visit (HOSPITAL_COMMUNITY)
Admission: RE | Admit: 2024-03-11 | Discharge: 2024-03-11 | Disposition: A | Discharge status: Home / Self Care | Source: Ambulatory Visit | Attending: Cardiology | Admitting: Cardiology

## 2024-03-11 DIAGNOSIS — R079 Chest pain, unspecified: Secondary | ICD-10-CM | POA: Insufficient documentation

## 2024-03-11 LAB — MYOCARDIAL PERFUSION IMAGING
LV dias vol: 91 mL (ref 62–150)
LV sys vol: 27 mL
Peak HR: 75 {beats}/min
Rest HR: 54 {beats}/min
Rest Nuclear Isotope Dose: 10.9 mCi
SDS: 0
SRS: 1
SSS: 0
ST Depression (mm): 0 mm
Stress Nuclear Isotope Dose: 31.8 mCi
TID: 1

## 2024-03-11 MED ORDER — TECHNETIUM TC 99M TETROFOSMIN IV KIT
10.9000 | PACK | Freq: Once | INTRAVENOUS | Status: AC | PRN
  Administered 2024-03-11: 10.9 via INTRAVENOUS

## 2024-03-11 MED ORDER — REGADENOSON 0.4 MG/5ML IV SOLN
INTRAVENOUS | Status: AC
  Filled 2024-03-11: qty 5

## 2024-03-11 MED ORDER — REGADENOSON 0.4 MG/5ML IV SOLN
0.4000 mg | Freq: Once | INTRAVENOUS | Status: AC
  Administered 2024-03-11: 0.4 mg via INTRAVENOUS

## 2024-03-11 MED ORDER — TECHNETIUM TC 99M TETROFOSMIN IV KIT
31.8000 | PACK | Freq: Once | INTRAVENOUS | Status: AC | PRN
  Administered 2024-03-11: 31.8 via INTRAVENOUS

## 2024-03-16 NOTE — Telephone Encounter (Signed)
 Will inform Dr. Edwyna of patient's concerns.  Alan, RN

## 2024-03-22 ENCOUNTER — Emergency Department (HOSPITAL_BASED_OUTPATIENT_CLINIC_OR_DEPARTMENT_OTHER)

## 2024-03-22 ENCOUNTER — Emergency Department (HOSPITAL_BASED_OUTPATIENT_CLINIC_OR_DEPARTMENT_OTHER)
Admission: EM | Admit: 2024-03-22 | Discharge: 2024-03-22 | Disposition: A | Discharge status: Home / Self Care | Attending: Emergency Medicine | Admitting: Emergency Medicine

## 2024-03-22 ENCOUNTER — Encounter (HOSPITAL_BASED_OUTPATIENT_CLINIC_OR_DEPARTMENT_OTHER): Payer: Self-pay | Admitting: Emergency Medicine

## 2024-03-22 ENCOUNTER — Ambulatory Visit: Attending: Cardiology

## 2024-03-22 ENCOUNTER — Other Ambulatory Visit: Payer: Self-pay

## 2024-03-22 DIAGNOSIS — Z794 Long term (current) use of insulin: Secondary | ICD-10-CM | POA: Diagnosis not present

## 2024-03-22 DIAGNOSIS — J101 Influenza due to other identified influenza virus with other respiratory manifestations: Secondary | ICD-10-CM | POA: Insufficient documentation

## 2024-03-22 DIAGNOSIS — R0602 Shortness of breath: Secondary | ICD-10-CM | POA: Insufficient documentation

## 2024-03-22 DIAGNOSIS — I10 Essential (primary) hypertension: Secondary | ICD-10-CM | POA: Insufficient documentation

## 2024-03-22 DIAGNOSIS — J4489 Other specified chronic obstructive pulmonary disease: Secondary | ICD-10-CM | POA: Insufficient documentation

## 2024-03-22 DIAGNOSIS — R002 Palpitations: Secondary | ICD-10-CM

## 2024-03-22 DIAGNOSIS — Z7901 Long term (current) use of anticoagulants: Secondary | ICD-10-CM | POA: Diagnosis not present

## 2024-03-22 DIAGNOSIS — Z79899 Other long term (current) drug therapy: Secondary | ICD-10-CM | POA: Diagnosis not present

## 2024-03-22 DIAGNOSIS — I480 Paroxysmal atrial fibrillation: Secondary | ICD-10-CM

## 2024-03-22 DIAGNOSIS — Z7984 Long term (current) use of oral hypoglycemic drugs: Secondary | ICD-10-CM | POA: Diagnosis not present

## 2024-03-22 DIAGNOSIS — E119 Type 2 diabetes mellitus without complications: Secondary | ICD-10-CM | POA: Diagnosis not present

## 2024-03-22 DIAGNOSIS — J111 Influenza due to unidentified influenza virus with other respiratory manifestations: Secondary | ICD-10-CM

## 2024-03-22 DIAGNOSIS — I2510 Atherosclerotic heart disease of native coronary artery without angina pectoris: Secondary | ICD-10-CM | POA: Insufficient documentation

## 2024-03-22 DIAGNOSIS — Z7982 Long term (current) use of aspirin: Secondary | ICD-10-CM | POA: Insufficient documentation

## 2024-03-22 DIAGNOSIS — R059 Cough, unspecified: Secondary | ICD-10-CM | POA: Diagnosis present

## 2024-03-22 LAB — BASIC METABOLIC PANEL WITH GFR
Anion gap: 14 (ref 5–15)
BUN: 18 mg/dL (ref 8–23)
CO2: 25 mmol/L (ref 22–32)
Calcium: 9.2 mg/dL (ref 8.9–10.3)
Chloride: 97 mmol/L — ABNORMAL LOW (ref 98–111)
Creatinine, Ser: 1.06 mg/dL (ref 0.61–1.24)
GFR, Estimated: >60 mL/min
Glucose, Bld: 266 mg/dL — ABNORMAL HIGH (ref 70–99)
Potassium: 4.6 mmol/L (ref 3.5–5.1)
Sodium: 135 mmol/L (ref 135–145)

## 2024-03-22 LAB — CBC
HCT: 39.1 % (ref 39.0–52.0)
Hemoglobin: 13.1 g/dL (ref 13.0–17.0)
MCH: 31 pg (ref 26.0–34.0)
MCHC: 33.5 g/dL (ref 30.0–36.0)
MCV: 92.7 fL (ref 80.0–100.0)
Platelets: 270 K/uL (ref 150–400)
RBC: 4.22 MIL/uL (ref 4.22–5.81)
RDW: 12.9 % (ref 11.5–15.5)
WBC: 9 K/uL (ref 4.0–10.5)
nRBC: 0 % (ref 0.0–0.2)

## 2024-03-22 LAB — PRO BRAIN NATRIURETIC PEPTIDE: Pro Brain Natriuretic Peptide: 133 pg/mL

## 2024-03-22 LAB — RESP PANEL BY RT-PCR (RSV, FLU A&B, COVID)  RVPGX2
Influenza A by PCR: POSITIVE — AB
Influenza B by PCR: NEGATIVE
Resp Syncytial Virus by PCR: NEGATIVE
SARS Coronavirus 2 by RT PCR: NEGATIVE

## 2024-03-22 LAB — TROPONIN T, HIGH SENSITIVITY: Troponin T High Sensitivity: 24 ng/L — ABNORMAL HIGH (ref 0–19)

## 2024-03-22 NOTE — ED Provider Notes (Signed)
 " [facility] Provider Note   CSN: [id number] Arrival date & time: 03/22/24  1407     Patient presents with: Cough   Nathan Durham is a 64 y.o. male with history of CAD status post CABG, hypertension, hyperlipidemia presents with persistent cough over the past few weeks.  Endorses dyspnea on exertion.  Complains of chest pain but states that he always has this as well.  Denies any abdominal pain or vomiting.  Reports that he  used to have COPD but resolved after leaving his job.  Does not smoke.    Cough  Past Medical History:  Diagnosis Date   Abnormality of gait 04/13/2014   Anxiety    Asthma    Cervical disc disorder with radiculopathy of cervical region 05/02/2014   Cervical spondylosis    Chronic pain    Coronary artery disease    Crohn disease (HCC)    Depression    Diabetes mellitus without complication (HCC)    Hyperlipidemia    Hypertension    Hypomagnesemia    Kidney stones    Myalgia    Neck pain 04/13/2014   Pain 05/09/2014   Slow transit constipation 04/13/2014   Stenosis of carotid artery    Thyroid disease    Past Surgical History:  Procedure Laterality Date   CORONARY ARTERY BYPASS GRAFT     KIDNEY STONE SURGERY         Prior to Admission medications  Medication Sig Start Date End Date Taking? Authorizing Provider  aspirin EC 81 MG tablet Take 81 mg by mouth daily. Swallow whole.    [provider]  atorvastatin (LIPITOR) 40 MG tablet Take 40 mg by mouth daily.    [provider]  diltiazem (CARDIZEM CD) 120 MG 24 hr capsule Take 120 mg by mouth daily.    [provider]  furosemide (LASIX) 40 MG tablet Take 40 mg by mouth daily. 01/07/24   [provider]  insulin regular (NOVOLIN R) 100 units/mL injection Inject 2-4 Units into the skin as directed.    [provider]  levothyroxine (SYNTHROID) 50 MCG tablet Take 50 mcg by mouth daily.    [provider]  losartan (COZAAR) 25 MG tablet Take 25 mg by mouth 2 (two) times daily.    [provider]  metFORMIN (GLUCOPHAGE) 1000 MG tablet Take 1,000 mg by mouth 2 (two) times daily with a meal.    [provider]  metoprolol tartrate (LOPRESSOR) 50 MG tablet Take 50 mg by mouth 2 (two) times daily.    [provider]  Multiple Vitamin (MULTIVITAMIN) capsule Take 1 capsule by mouth daily.    [provider]  nitroGLYCERIN  (NITROSTAT ) 0.4 MG SL tablet Place 1 tablet (0.4 mg total) under the tongue every 5 (five) minutes as needed for chest pain. 02/10/24 05/10/24  Revankar, Rajan R, MD  potassium chloride SA (KLOR-CON M) 20 MEQ tablet Take 20 mEq by mouth daily. 03/09/23   [provider]  predniSONE  (DELTASONE ) 20 MG tablet Take 10 mg by mouth daily. 03/28/22   [provider]  rivaroxaban (XARELTO) 20 MG TABS tablet Take 20 mg by mouth daily. 03/26/15 04/07/24  [provider]  spironolactone (ALDACTONE) 25 MG tablet Take 12.5 mg by mouth daily.    [provider]  tamsulosin (FLOMAX) 0.4 MG CAPS capsule Take 0.4 mg by mouth daily. 02/08/24   [provider]    Allergies: Hydromorphone , Cymbalta [duloxetine  hcl], Duloxetine, Pregabalin, and Celebrex [celecoxib]    Review of Systems  Respiratory:  Positive for cough.     Updated Vital Signs BP (!) 141/93 (BP Location: Right Arm)   Pulse 67   Temp 98.7 F (37.1 C) (Oral)   Resp 16   Ht 5' 10 (1.778 m)   Wt 108.9 kg   SpO2 100%   BMI 34.44 kg/m   Physical Exam Vitals and nursing note reviewed.  Constitutional:      General: He is not in acute distress.    Appearance: He is well-developed.  HENT:     Head: Normocephalic and atraumatic.  Eyes:     Conjunctiva/sclera: Conjunctivae normal.  Cardiovascular:     Rate and Rhythm: Normal rate and regular rhythm.     Heart sounds: No murmur heard. Pulmonary:     Effort: Pulmonary effort is normal. No  respiratory distress.     Comments: Diminished lung sounds without any obvious rales or wheezes Abdominal:     Palpations: Abdomen is soft.     Tenderness: There is no abdominal tenderness.  Musculoskeletal:        General: No swelling.     Cervical back: Neck supple.  Skin:    General: Skin is warm and dry.     Capillary Refill: Capillary refill takes less than 2 seconds.  Neurological:     Mental Status: He is alert.  Psychiatric:        Mood and Affect: Mood normal.     (all labs ordered are listed, but only abnormal results are displayed) Labs Reviewed  RESP PANEL BY RT-PCR (RSV, FLU A&B, COVID)  RVPGX2 - Abnormal; Notable for the following components:      Result Value   Influenza A by PCR POSITIVE (*)    All other components within normal limits  BASIC METABOLIC PANEL WITH GFR - Abnormal; Notable for the following components:   Chloride 97 (*)    Glucose, Bld 266 (*)    All other components within normal limits  TROPONIN T, HIGH SENSITIVITY - Abnormal; Notable for the following components:   Troponin T High Sensitivity 24 (*)    All other components within normal limits  CBC  PRO BRAIN NATRIURETIC PEPTIDE  TROPONIN T, HIGH SENSITIVITY    EKG: None  Radiology: DG Chest 2 View Result Date: 03/22/2024 EXAM: 2 VIEW(S) XRAY OF THE CHEST 03/22/2024 03:20:00 PM COMPARISON: 12/19/2023 CLINICAL HISTORY: shob FINDINGS: LUNGS AND PLEURA: No focal pulmonary opacity. No pleural effusion. No pneumothorax. HEART AND MEDIASTINUM: CABG markers noted. Left atrial appendage occlusion clip noted. Aortic atherosclerosis. BONES AND SOFT TISSUES: Sternotomy wires noted. Multilevel thoracic osteophytosis. IMPRESSION: 1. No acute cardiopulmonary abnormality. Electronically signed by: Rogelia Myers MD 03/22/2024 03:57 PM EST RP Workstation: [id number]     Procedures   Medications Ordered in the ED - No data to display  Clinical Course as of 03/22/24 2233  Tue Mar 22, 2024  2008 Patient  with significant cardiovascular history evaluated for 3 weeks of cough, shortness of breath.  Additionally complaining of chest pain.  Upon arrival he is hemodynamically stable and nontoxic-appearing.  His lung sounds are diminished without any obvious rales or wheezes.  Reportedly has a remote history of COPD that improved after leaving his job. [JT]  2013 ED EKG Sinus rhythm, RBBB no ischemic changes [JT]  2013 CBC No leukocytosis, hemoglobin stable [JT]  2013 Basic metabolic panel(!) Glucose 266, no other significant findings [JT]  2103 Pro Brain natriuretic  peptide Without elevation [JT]  2103 Troponin T, High Sensitivity(!) Elevated at 24, however below baseline [JT]  2104 Resp panel by RT-PCR (RSV, Flu A&B, Covid) Anterior Nasal Swab(!) Positive influenza A [JT]  2232 Workup overall notable for influenza.  Troponin is slightly elevated however this is below his baseline.  Do not feel that delta is indicated.  Patient also declines.  Patient discharged home with supportive care.  Strict return precautions provided.  Patient is understanding agreement plan. [JT]    Clinical Course User Index [JT] Donnajean Lynwood DEL, PA-C                                 Medical Decision Making Amount and/or Complexity of Data Reviewed Labs: ordered. Decision-making details documented in ED Course. Radiology: ordered. ECG/medicine tests:  Decision-making details documented in ED Course.   This patient presents to the ED with chief complaint(s) of cough.  The complaint involves an extensive differential diagnosis and also carries with it a high risk of complications and morbidity.   Pertinent past medical history as listed in HPI  The differential diagnosis includes  Do not suspect pneumonia as patient has clear lung sounds, no consolidation on x-ray, is afebrile without leukocytosis.  Do not suspect ACS as patient is without any ischemic changes.  Troponin is at baseline.  Based off exam and history  do not suspect PE.    Additional history obtained: Additional history obtained from family Records reviewed Care Everywhere/External Records  Disposition:   Patient will be discharged home. The patient has been appropriately medically screened and/or stabilized in the ED. I have low suspicion for any other emergent medical condition which would require further screening, evaluation or treatment in the ED or require inpatient management. At time of discharge the patient is hemodynamically stable and in no acute distress. I have discussed work-up results and diagnosis with patient and answered all questions. Patient is agreeable with discharge plan. We discussed strict return precautions for returning to the emergency department and they verbalized understanding.     Social Determinants of Health:   none  This note was dictated with voice recognition software.  Despite best efforts at proofreading, errors may have occurred which can change the documentation meaning.       Final diagnoses:  Influenza    ED Discharge Orders     None          Donnajean Lynwood DEL DEVONNA 03/22/24 2233    Patsey Lot, MD 03/25/24 [phone]  "

## 2024-03-22 NOTE — ED Notes (Signed)
 Pulse Oximetry while ambulating was 96% no signs of SOB.

## 2024-03-22 NOTE — ED Triage Notes (Signed)
 Pt c/o ongoing cough, malaise, dyspnea on exertion x 2 weeks.  Denies known fever, n/v/d.

## 2024-03-22 NOTE — Telephone Encounter (Signed)
-----   Message from Jennifer Crape, MD sent at 03/16/2024  1:56 PM EST ----- He needs to get a TSH and also 2-week monitor to assess this ----- Message ----- From: Glenford Alan CROME, RN Sent: 03/16/2024   8:50 AM EST To: Jennifer JONELLE Crape, MD   ----- Message ----- From: Crape Jennifer JONELLE, MD Sent: 03/11/2024   5:45 PM EST To: Melene Meissner, RN  The results of the study is unremarkable. Please inform patient. I will discuss in detail at next appointment. Cc  primary care/referring physician Jennifer JONELLE Crape, MD 03/11/2024 5:45 PM

## 2024-03-22 NOTE — Telephone Encounter (Signed)
 Recommendations reviewed with pt as per Dr. Kem Parkinson note.  Pt verbalized understanding and had no additional questions.

## 2024-03-22 NOTE — ED Notes (Signed)

## 2024-03-22 NOTE — Discharge Instructions (Addendum)
 You were evaluated in the emergency room for cough.  You are found to have influenza..You may use Tylenol  1000 mg and/or Motrin  600 mg every 4-6 hours up to 3 times a day for fever or body aches.  Please keep in mind that this dosing is not meant to be continued long-term and that many over-the-counter cough and flu medications contain acetaminophen  or ibuprofen . You can expect your current symptoms to linger over the next week or two but please return to the emergency room if you experience any new or worsening symptoms including persistent fevers, worsening productive cough and persistent vomiting.

## 2024-03-22 NOTE — ED Notes (Signed)
 The patient is refusing any staff to drawn his second troponin. This RN explained to him the purpose but he states he has had his heart enzymes drawn numerous times over the past year and its always going to be the same result. He is ready to go. Hunter, PA informed.

## 2024-03-25 ENCOUNTER — Telehealth: Payer: Self-pay | Admitting: Cardiology

## 2024-03-25 NOTE — Telephone Encounter (Signed)
 Pt needs help with heart monitor. Please advise

## 2024-03-30 NOTE — Telephone Encounter (Signed)
 Pt states that he has the monitor on but at night he sweats and the monitor came off. Pt states that he has it taped on and there are no lights on it. Pt aware to call the company for continued issues. Pt verbalized understanding and had no additional questions.

## 2024-03-31 ENCOUNTER — Telehealth: Payer: Self-pay | Admitting: *Deleted

## 2024-03-31 NOTE — Telephone Encounter (Signed)
 This email is to notify you that the patient had a fall off 1 DAY  into the wear time and we were unable to resolve the issue with troubleshooting.  We have placed an order for a replacement device to be shipped to the patient and placed a hold on the billing, so the patient is not charged for the first device.   Patient initials: IVAR HERO  MRN: [id number] Serial Number: [id number]  Account: [facility] [place]

## 2024-04-12 ENCOUNTER — Telehealth: Payer: Self-pay | Admitting: Cardiology

## 2024-04-12 NOTE — Telephone Encounter (Signed)
 Wife Villa) stated patient will be dropping off jury letter for jury duty.

## 2024-05-25 ENCOUNTER — Ambulatory Visit: Admitting: Physician Assistant
